# Patient Record
Sex: Male | Born: 1945
Health system: Southern US, Community
[De-identification: ages and names within clinical notes are randomized; demographics above are authoritative.]

## PROBLEM LIST (undated history)

## (undated) DIAGNOSIS — E119 Type 2 diabetes mellitus without complications: Secondary | ICD-10-CM

## (undated) DIAGNOSIS — E785 Hyperlipidemia, unspecified: Secondary | ICD-10-CM

## (undated) DIAGNOSIS — M1990 Unspecified osteoarthritis, unspecified site: Secondary | ICD-10-CM

## (undated) DIAGNOSIS — J449 Chronic obstructive pulmonary disease, unspecified: Secondary | ICD-10-CM

## (undated) DIAGNOSIS — N529 Male erectile dysfunction, unspecified: Secondary | ICD-10-CM

## (undated) DIAGNOSIS — N400 Enlarged prostate without lower urinary tract symptoms: Secondary | ICD-10-CM

## (undated) HISTORY — DX: Type 2 diabetes mellitus without complications: E11.9

## (undated) HISTORY — DX: Hyperlipidemia, unspecified: E78.5

## (undated) HISTORY — DX: Unspecified osteoarthritis, unspecified site: M19.90

---

## 2009-10-08 ENCOUNTER — Emergency Department (HOSPITAL_BASED_OUTPATIENT_CLINIC_OR_DEPARTMENT_OTHER): Admission: EM | Admit: 2009-10-08 | Discharge: 2009-10-08 | Payer: Self-pay | Admitting: Emergency Medicine

## 2009-10-08 ENCOUNTER — Ambulatory Visit: Payer: Self-pay | Admitting: Diagnostic Radiology

## 2010-12-24 IMAGING — CR DG CHEST 2V
2 series · 2 of 2 positions shown · non-contrast
Comparison: None

CLINICAL DATA: Short of breath.  Cough.  Smoking history.

CHEST - 2 VIEW

[w chest pa]
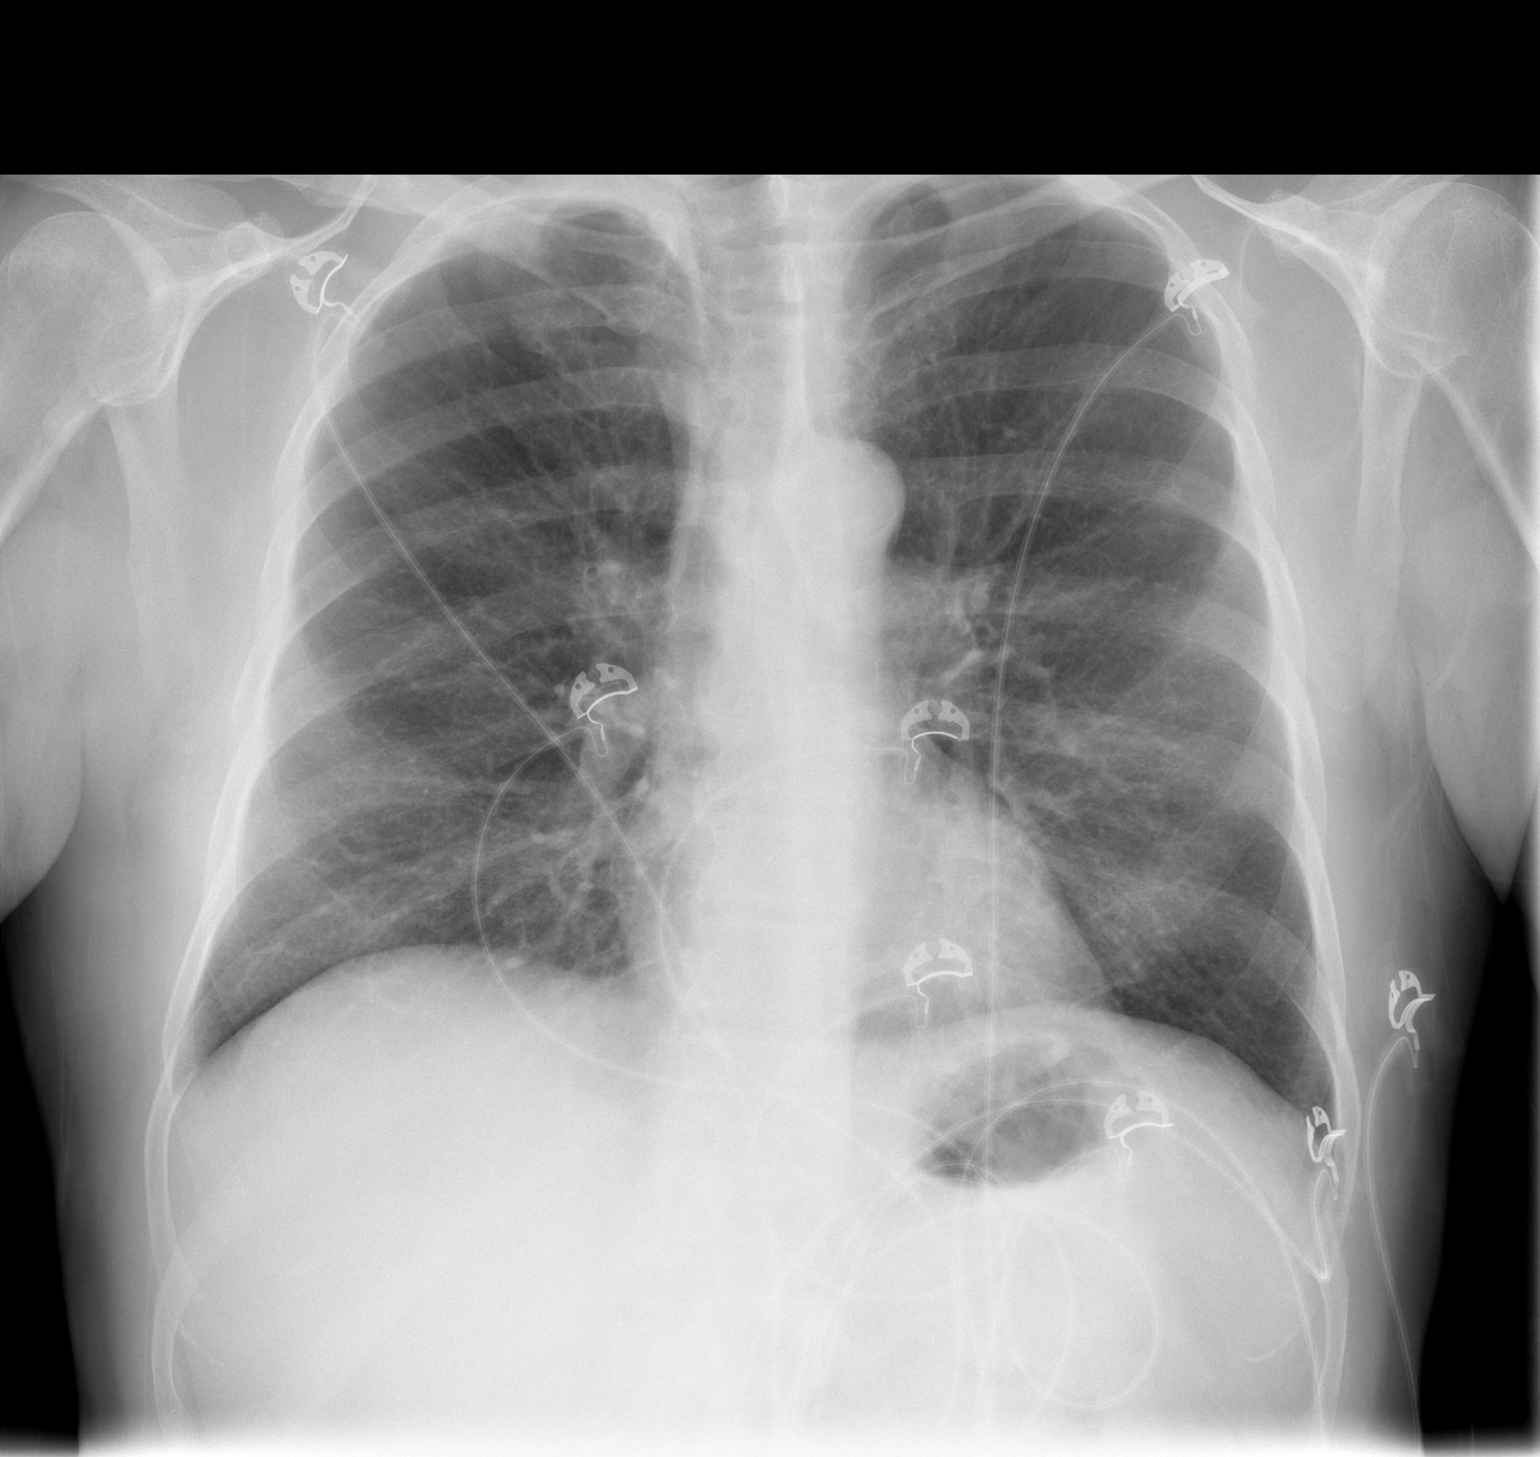

[w chest lat]
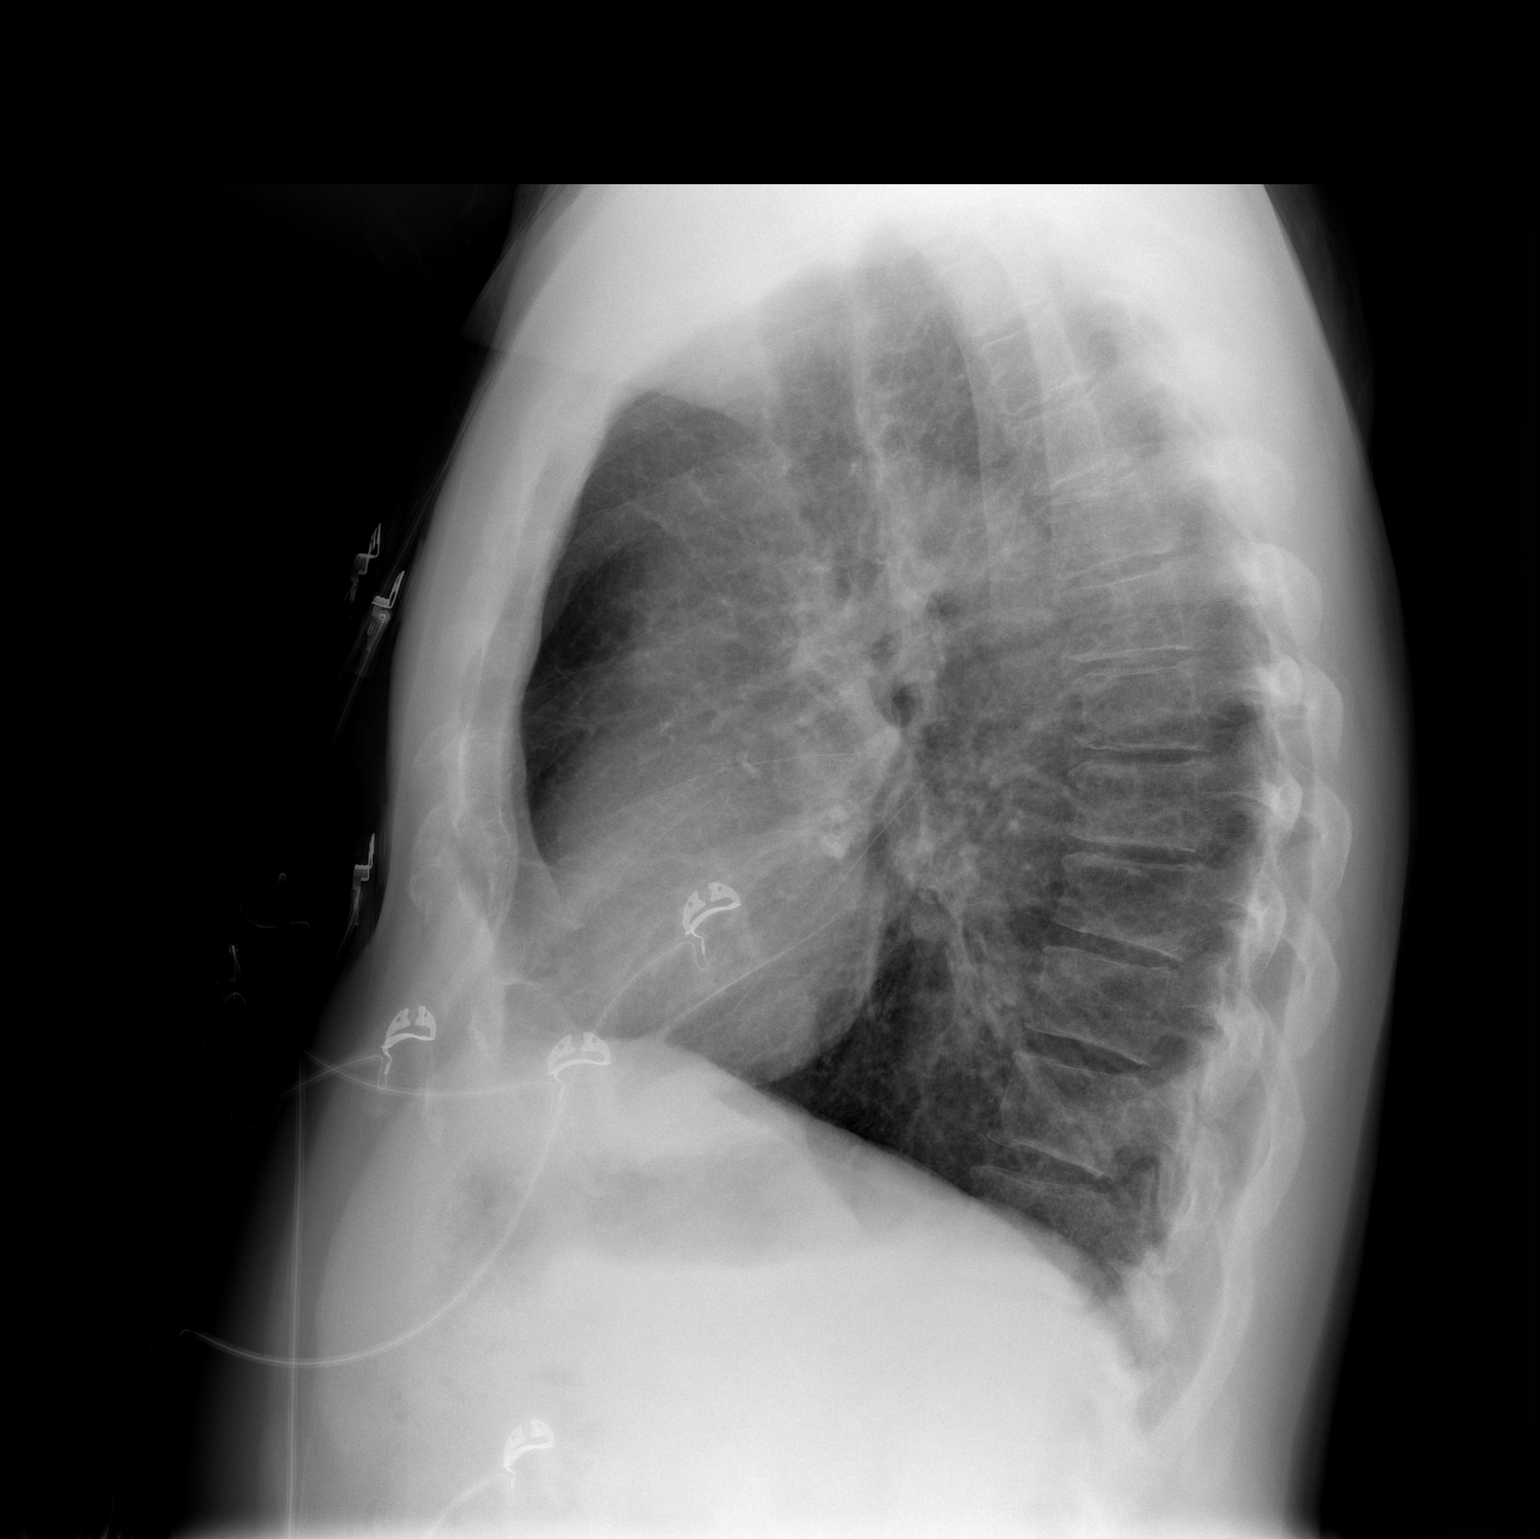

[2 of 2 positions shown; findings below may reference images not displayed]

FINDINGS: Artifact overlies the chest.  Heart size is normal.  The
mediastinum is normal.  There are slightly prominent interstitial
markings consistent with a smoking history but there is no sign of
infiltrate, collapse or effusion.  No pulmonary edema.  No
significant bony finding.
IMPRESSION: No focal or active process evident.  Slightly prominent
interstitial markings that could relate to a smoking history.

## 2017-10-07 ENCOUNTER — Other Ambulatory Visit: Payer: Self-pay

## 2017-10-07 ENCOUNTER — Emergency Department (HOSPITAL_BASED_OUTPATIENT_CLINIC_OR_DEPARTMENT_OTHER): Payer: Medicare Other

## 2017-10-07 ENCOUNTER — Encounter (HOSPITAL_BASED_OUTPATIENT_CLINIC_OR_DEPARTMENT_OTHER): Payer: Self-pay

## 2017-10-07 ENCOUNTER — Emergency Department (HOSPITAL_BASED_OUTPATIENT_CLINIC_OR_DEPARTMENT_OTHER)
Admission: EM | Admit: 2017-10-07 | Discharge: 2017-10-07 | Disposition: A | Discharge status: Home / Self Care | Payer: Medicare Other | Attending: Emergency Medicine | Admitting: Emergency Medicine

## 2017-10-07 DIAGNOSIS — J441 Chronic obstructive pulmonary disease with (acute) exacerbation: Secondary | ICD-10-CM | POA: Insufficient documentation

## 2017-10-07 DIAGNOSIS — Z87891 Personal history of nicotine dependence: Secondary | ICD-10-CM | POA: Diagnosis not present

## 2017-10-07 DIAGNOSIS — R0602 Shortness of breath: Secondary | ICD-10-CM | POA: Diagnosis present

## 2017-10-07 HISTORY — DX: Male erectile dysfunction, unspecified: N52.9

## 2017-10-07 HISTORY — DX: Chronic obstructive pulmonary disease, unspecified: J44.9

## 2017-10-07 HISTORY — DX: Benign prostatic hyperplasia without lower urinary tract symptoms: N40.0

## 2017-10-07 LAB — COMPREHENSIVE METABOLIC PANEL
ALT: 22 U/L (ref 17–63)
ANION GAP: 9 (ref 5–15)
AST: 28 U/L (ref 15–41)
Albumin: 4.1 g/dL (ref 3.5–5.0)
Alkaline Phosphatase: 80 U/L (ref 38–126)
BUN: 12 mg/dL (ref 6–20)
CHLORIDE: 106 mmol/L (ref 101–111)
CO2: 25 mmol/L (ref 22–32)
CREATININE: 0.88 mg/dL (ref 0.61–1.24)
Calcium: 9.5 mg/dL (ref 8.9–10.3)
Glucose, Bld: 124 mg/dL — ABNORMAL HIGH (ref 65–99)
Potassium: 3.9 mmol/L (ref 3.5–5.1)
SODIUM: 140 mmol/L (ref 135–145)
Total Bilirubin: 0.8 mg/dL (ref 0.3–1.2)
Total Protein: 7.7 g/dL (ref 6.5–8.1)

## 2017-10-07 LAB — CBC WITH DIFFERENTIAL/PLATELET
Basophils Absolute: 0.1 10*3/uL (ref 0.0–0.1)
Basophils Relative: 1 %
EOS ABS: 1 10*3/uL — AB (ref 0.0–0.7)
EOS PCT: 13 %
HCT: 47.4 % (ref 39.0–52.0)
Hemoglobin: 15.9 g/dL (ref 13.0–17.0)
LYMPHS ABS: 2.3 10*3/uL (ref 0.7–4.0)
LYMPHS PCT: 30 %
MCH: 31.5 pg (ref 26.0–34.0)
MCHC: 33.5 g/dL (ref 30.0–36.0)
MCV: 93.9 fL (ref 78.0–100.0)
MONO ABS: 0.9 10*3/uL (ref 0.1–1.0)
MONOS PCT: 11 %
Neutro Abs: 3.4 10*3/uL (ref 1.7–7.7)
Neutrophils Relative %: 45 %
PLATELETS: 287 10*3/uL (ref 150–400)
RBC: 5.05 MIL/uL (ref 4.22–5.81)
RDW: 13.8 % (ref 11.5–15.5)
WBC: 7.6 10*3/uL (ref 4.0–10.5)

## 2017-10-07 LAB — TROPONIN I

## 2017-10-07 MED ORDER — ALBUTEROL SULFATE (2.5 MG/3ML) 0.083% IN NEBU
2.5000 mg | INHALATION_SOLUTION | Freq: Once | RESPIRATORY_TRACT | Status: AC
  Administered 2017-10-07: 2.5 mg via RESPIRATORY_TRACT
  Filled 2017-10-07: qty 3

## 2017-10-07 MED ORDER — ALBUTEROL SULFATE HFA 108 (90 BASE) MCG/ACT IN AERS
2.0000 | INHALATION_SPRAY | RESPIRATORY_TRACT | Status: DC
  Administered 2017-10-07: 2 via RESPIRATORY_TRACT
  Filled 2017-10-07: qty 6.7

## 2017-10-07 MED ORDER — PREDNISONE 20 MG PO TABS: ORAL_TABLET | ORAL | 0 refills | Status: DC

## 2017-10-07 MED ORDER — METHYLPREDNISOLONE SODIUM SUCC 125 MG IJ SOLR
125.0000 mg | Freq: Once | INTRAMUSCULAR | Status: AC
  Administered 2017-10-07: 125 mg via INTRAVENOUS
  Filled 2017-10-07: qty 2

## 2017-10-07 MED ORDER — IPRATROPIUM-ALBUTEROL 0.5-2.5 (3) MG/3ML IN SOLN
3.0000 mL | Freq: Once | RESPIRATORY_TRACT | Status: AC
  Administered 2017-10-07: 3 mL via RESPIRATORY_TRACT
  Filled 2017-10-07: qty 3

## 2017-10-07 NOTE — ED Notes (Signed)
Patient transported to X-ray 

## 2017-10-07 NOTE — ED Notes (Signed)
ED Provider at bedside. 

## 2017-10-07 NOTE — Discharge Instructions (Signed)
1.  Use your albuterol inhaler every 4-6 hours for the next 2-3 days.  Take prednisone as prescribed. 2.  Return to the emergency department if your symptoms are worsening or not improving. 2.  Schedule a follow-up with your physician as soon as possible.

## 2017-10-07 NOTE — ED Provider Notes (Signed)
[facility] EMERGENCY DEPARTMENT Provider Note   CSN: [id number] Arrival date & time: 10/07/17  1835     History   Chief Complaint Chief Complaint  Patient presents with  . Shortness of Breath    HPI Eugene Daniels is a 71 y.o. male.  HPI Patient has a long history of COPD.  He quit smoking approximately 5 years ago.  He reports that he has needed his inhalers more frequently over the past 2-3 days.  No associated chest pain.  Mild cough.  Patient reports that he feels like he gets phlegm in his throat.  No fever.  No swelling or pain of the legs.  Patient reports this feels like a COPD flareup.  He reports in the past he has been hospitalized occasionally for his COPD and responded to steroids.  Patient is visiting from [place] with family.  He got here over a week ago.  Patient had a 45-minute direct flight with no prolonged periods of sitting or immobilization.  No history of PE or DVT.  No known cardiac history. Past Medical History:  Diagnosis Date  . COPD (chronic obstructive pulmonary disease) (HCC)   . ED (erectile dysfunction)   . Enlarged prostate     There are no active problems to display for this patient.   History reviewed. No pertinent surgical history.     Home Medications    Prior to Admission medications   Medication Sig Start Date End Date Taking? Authorizing Provider  predniSONE (DELTASONE) 20 MG tablet 3 tabs po day one, then 2 tabs daily x 4 days 10/07/17   Arby BarrettePfeiffer, Fredia [place], MD    Family History No family history on file.  Social History Social History   Tobacco Use  . Smoking status: Former Games developermoker  . Smokeless tobacco: Never Used  Substance Use Topics  . Alcohol use: Yes    Comment: occ  . Drug use: No     Allergies   Patient has no known allergies.   Review of Systems Review of Systems 10 Systems reviewed and are negative for acute change except as noted in the HPI.   Physical Exam Updated Vital Signs BP 131/77    Pulse 96   Temp 98 F (36.7 C) (Oral)   Resp (!) 22   Ht 5\' 6"  (1.676 m)   Wt 70.8 kg (156 lb)   SpO2 96%   BMI 25.18 kg/m   Physical Exam  Constitutional: He appears well-developed and well-nourished. No distress.  HENT:  Head: Normocephalic and atraumatic.  Mouth/Throat: Oropharynx is clear and moist.  Eyes: Conjunctivae and EOM are normal.  Neck: Neck supple.  Cardiovascular: Normal rate and regular rhythm.  No murmur heard. Pulmonary/Chest:  Mild increased work of breathing.  No respiratory distress.  Very soft breath sounds from mid to bases.  rexpiratiry wheeze anteriorly and right greater than left.  Abdominal: Soft. There is no tenderness.  Musculoskeletal: He exhibits no edema or tenderness.  No peripheral edema no calf tenderness.  Neurological: He is alert.  Skin: Skin is warm and dry.  Psychiatric: He has a normal mood and affect.  Nursing note and vitals reviewed.    ED Treatments / Results  Labs (all labs ordered are listed, but only abnormal results are displayed) Labs Reviewed  CBC WITH DIFFERENTIAL/PLATELET - Abnormal; Notable for the following components:      Result Value   Eosinophils Absolute 1.0 (*)    All other components within normal limits  COMPREHENSIVE METABOLIC PANEL -  Abnormal; Notable for the following components:   Glucose, Bld 124 (*)    All other components within normal limits  TROPONIN I    EKG  EKG Interpretation  Date/Time:  Wednesday October 07 2017 18:52:37 EST Ventricular Rate:  88 PR Interval:    QRS Duration: 72 QT Interval:  349 QTC Calculation: 423 R Axis:   90 Text Interpretation:  Sinus rhythm Borderline right axis deviation Baseline wander in lead(s) V6 no acute ischemic appearance, otherwise normal Confirmed by Arby BarrettePfeiffer, Deija Buhrman [phone]([id number]) on 10/07/2017 8:01:56 PM       Radiology Dg Chest 2 View  Result Date: 10/07/2017 CLINICAL DATA:  71 year old male with shortness of breath. EXAM: CHEST  2 VIEW  COMPARISON:  Chest radiograph dated 10/08/2009 FINDINGS: There is emphysematous changes of the lungs with mild diffuse interstitial coarsening. No focal consolidation, pleural effusion, or pneumothorax. The cardiac silhouette is within normal limits. Atherosclerotic calcification of the aortic arch. No acute osseous pathology. IMPRESSION: No active cardiopulmonary disease. Electronically Signed   By: Elgie CollardArash  Radparvar M.D.   On: 10/07/2017 19:20    Procedures Procedures (including critical care time)  Medications Ordered in ED Medications  albuterol (PROVENTIL HFA;VENTOLIN HFA) 108 (90 Base) MCG/ACT inhaler 2 puff (2 puffs Inhalation Given 10/07/17 2251)  ipratropium-albuterol (DUONEB) 0.5-2.5 (3) MG/3ML nebulizer solution 3 mL (3 mLs Nebulization Given 10/07/17 1852)  albuterol (PROVENTIL) (2.5 MG/3ML) 0.083% nebulizer solution 2.5 mg (2.5 mg Nebulization Given 10/07/17 1852)  albuterol (PROVENTIL) (2.5 MG/3ML) 0.083% nebulizer solution 2.5 mg (2.5 mg Nebulization Given 10/07/17 1928)  albuterol (PROVENTIL) (2.5 MG/3ML) 0.083% nebulizer solution 2.5 mg (2.5 mg Nebulization Given 10/07/17 2007)  methylPREDNISolone sodium succinate (SOLU-MEDROL) 125 mg/2 mL injection 125 mg (125 mg Intravenous Given 10/07/17 2016)     Initial Impression / Assessment and Plan / ED Course  I have reviewed the triage vital signs and the nursing notes.  Pertinent labs & imaging results that were available during my care of the patient were reviewed by me and considered in my medical decision making (see chart for details).      Final Clinical Impressions(s) / ED Diagnoses   Final diagnoses:  COPD exacerbation (HCC)   Patient much improved after albuterol nebulizer treatment and Solu-Medrol.  Patient does not have hypoxia, confusion or any findings of infectious pneumonia.  Patient will continue prednisone and albuterol.  He is instructed to return with any worsening or changing of symptoms. ED Discharge  Orders        Ordered    predniSONE (DELTASONE) 20 MG tablet     10/07/17 2253       Arby BarrettePfeiffer, Chee Kinslow, MD 10/07/17 2254

## 2017-10-07 NOTE — ED Triage Notes (Signed)
C/o SOB, "COPD" x 2 days-presents to triage with tachypnea-RT in triage for assessment

## 2018-06-23 ENCOUNTER — Telehealth: Payer: Self-pay

## 2018-06-23 NOTE — Telephone Encounter (Signed)
Copied from CRM [id number]. Topic: Appointment Scheduling - New Patient >> Jun 23, 2018  2:53 PM Stephannie Li, [place] wrote: New patient has been scheduled for your office. Provider: wendling  Date of Appointment: 07/07/18  Route to department's PEC pool.  Ok.

## 2018-07-07 ENCOUNTER — Ambulatory Visit (INDEPENDENT_AMBULATORY_CARE_PROVIDER_SITE_OTHER): Payer: Medicare PPO | Admitting: Family Medicine

## 2018-07-07 ENCOUNTER — Encounter: Payer: Self-pay | Admitting: Family Medicine

## 2018-07-07 VITALS — BP 114/64 | HR 90 | Temp 98.1°F | Resp 16 | Ht 66.0 in | Wt 161.0 lb

## 2018-07-07 DIAGNOSIS — R350 Frequency of micturition: Secondary | ICD-10-CM

## 2018-07-07 DIAGNOSIS — G2581 Restless legs syndrome: Secondary | ICD-10-CM | POA: Diagnosis not present

## 2018-07-07 DIAGNOSIS — I739 Peripheral vascular disease, unspecified: Secondary | ICD-10-CM

## 2018-07-07 DIAGNOSIS — B351 Tinea unguium: Secondary | ICD-10-CM | POA: Diagnosis not present

## 2018-07-07 DIAGNOSIS — J449 Chronic obstructive pulmonary disease, unspecified: Secondary | ICD-10-CM

## 2018-07-07 DIAGNOSIS — N401 Enlarged prostate with lower urinary tract symptoms: Secondary | ICD-10-CM

## 2018-07-07 MED ORDER — BENZONATATE 100 MG PO CAPS: 100.0000 mg | ORAL_CAPSULE | Freq: Three times a day (TID) | ORAL | 1 refills | Status: DC | PRN

## 2018-07-07 MED ORDER — PREDNISONE 5 MG PO TABS: ORAL_TABLET | ORAL | 0 refills | Status: DC

## 2018-07-07 MED ORDER — FLUTICASONE-SALMETEROL 250-50 MCG/DOSE IN AEPB: 1.0000 | INHALATION_SPRAY | Freq: Two times a day (BID) | RESPIRATORY_TRACT | 2 refills | Status: DC

## 2018-07-07 NOTE — Progress Notes (Signed)
CC: NP, est care     New Patient Visit SUBJECTIVE: HPI: Eugene Daniels is an 72 y.o.male who is being seen for establishing care.  The patient was previously seen in [place].  He is here with his daughter.  He has a history of COPD.  Currently his inhalers include an anticholinergic muscarinic antagonist and rescue inhaler.  He is to be on other inhalers as well but did not get them was moving to [place].  He was given Jerilynn Som for a cough that seem to be helpful.  He is also currently on steroids after being treated for an exacerbation.  He would like to come off of them.  He has a history of toenail fungus.  He is not interested in any oral medication that could be damaging to his liver.  He is willing to try a topical medication.  There is no pain or drainage.  He has a history of RLS with associated peripheral arterial disease.  He was told that he has blockage in both of his legs.  He is currently on aspirin and a statin.  He is not having any pain.  He has never been on any medication for RLS.  He is unsure if his iron levels were rechecked.  He has a history of an enlarged prostate.  He takes a supplement that is over-the-counter to help with this.  He still having frequent urination, particularly at night.  He has been on the medication for this in the past, however states it was not easy on his stomach.  No Known Allergies  Past Medical History:  Diagnosis Date  . Arthritis   . COPD (chronic obstructive pulmonary disease) (HCC)   . ED (erectile dysfunction)   . Enlarged prostate   . Hyperlipidemia    History reviewed. No pertinent surgical history. History reviewed. No pertinent family history. No Known Allergies  Current Outpatient Medications:  .  Albuterol Sulfate (PROAIR HFA IN), Inhale into the lungs., Disp: , Rfl:  .  aspirin 81 MG tablet, Take 81 mg by mouth daily., Disp: , Rfl:  .  atorvastatin (LIPITOR) 40 MG tablet, Take 40 mg by mouth daily., Disp:  , Rfl:  .  loratadine (CLARITIN) 10 MG tablet, Take 10 mg by mouth daily., Disp: , Rfl:  .  Melatonin 3 MG CAPS, Take 3 mg by mouth at bedtime as needed., Disp: , Rfl:  .  umeclidinium bromide (INCRUSE ELLIPTA) 62.5 MCG/INH AEPB, Inhale 1 puff into the lungs daily., Disp: , Rfl:  .  UNABLE TO FIND, Prostate Defense, Disp: , Rfl:  .  benzonatate (TESSALON) 100 MG capsule, Take 1 capsule (100 mg total) by mouth 3 (three) times daily as needed., Disp: 90 capsule, Rfl: 1 .  Fluticasone-Salmeterol (ADVAIR) 250-50 MCG/DOSE AEPB, Inhale 1 puff into the lungs 2 (two) times daily., Disp: 60 each, Rfl: 2 .  predniSONE (DELTASONE) 5 MG tablet, Take 3 tabs/day for 5 days then 2 tabs/day for 5 days then 1 tab daily., Disp: 30 tablet, Rfl: 0  ROS 10 point review of systems is negative unless otherwise noted in HPI  OBJECTIVE: BP 114/64   Pulse 90   Temp 98.1 F (36.7 C) (Oral)   Resp 16   Ht 5\' 6"  (1.676 m)   Wt 161 lb (73 kg)   SpO2 97%   BMI 25.99 kg/m   Constitutional: -  VS reviewed -  Well developed, well nourished, appears stated age -  No apparent distress  Psychiatric: -  Oriented to person, place, and time -  Memory intact -  Affect and mood normal -  Fluent conversation, good eye contact -  Judgment and insight age appropriate  Eye: -  Conjunctivae clear, no discharge -  Pupils symmetric, round, reactive to light  ENMT: -  MMM    Pharynx moist, no exudate, no erythema  Neck: -  No gross swelling, no palpable masses -  Thyroid midline, not enlarged, mobile, no palpable masses  Cardiovascular: -  RRR -  No LE edema  Respiratory: -  Normal respiratory effort, no accessory muscle use, no retraction -  Breath sounds equal, no wheezes, no ronchi, no crackles  Gastrointestinal: -  Bowel sounds normal -  No tenderness, no distention, no guarding, no masses  Neurological:  -  CN II - XII grossly intact -  Sensation grossly intact to light touch, equal bilaterally  Musculoskeletal: -   No clubbing, no cyanosis -  Gait normal  Skin: -Toenails on left foot are discolored and hypertrophic, there is no tenderness upon palpation of any of the nail suggesting an ingrown toenail -  Warm and dry to palpation   ASSESSMENT/PLAN: Benign prostatic hyperplasia with urinary frequency  PAD (peripheral artery disease) (HCC) - Plan: atorvastatin (LIPITOR) 40 MG tablet, aspirin 81 MG tablet  RLS (restless legs syndrome) - Plan: CBC, Ferritin, IBC panel  Onychomycosis  Chronic obstructive pulmonary disease, unspecified COPD type (HCC) - Plan: umeclidinium bromide (INCRUSE ELLIPTA) 62.5 MCG/INH AEPB, Albuterol Sulfate (PROAIR HFA IN), benzonatate (TESSALON) 100 MG capsule, Fluticasone-Salmeterol (ADVAIR) 250-50 MCG/DOSE AEPB, predniSONE (DELTASONE) 5 MG tablet  Patient instructed to sign release of records form from his previous PCP. For BPH, continue over-the-counter supplement.  I will await records and see if his reaction was to an alpha-blocker. Will await records on PAD and RLS.  We will check iron levels today.  Consider starting Requip depending on the results. Discussed oral medication for #4.  He would prefer topical medication, I recommended Vicks VapoRub/menthol.  I did advise that this could take a long time. We will order an inhaled corticosteroid/long-acting beta agonist in addition to his long-acting muscarinic antagonist.  Albuterol as needed.  Will temporarily refill Tessalon Perles.  I will start to wean him down from his current prednisone. Patient should return in 1 month. The patient voiced understanding and agreement to the plan.   Jilda Roche [place], DO 07/07/18  5:11 PM

## 2018-07-07 NOTE — Patient Instructions (Addendum)
We are holding off on medication for prostate and RLS until seeing records.  Vicks Vaporub daily on toenails to treat fungus. This can take a long time.  Rinse your mouth out after using new inhaler (Advair).  Start weaning down on steroids/prednisone.  Give [place] 2-3 business days to get the results of your labs back.   Let us know if you need anything.

## 2018-07-08 LAB — CBC
HEMATOCRIT: 43.5 % (ref 39.0–52.0)
HEMOGLOBIN: 14.8 g/dL (ref 13.0–17.0)
MCHC: 34.1 g/dL (ref 30.0–36.0)
MCV: 93.1 fl (ref 78.0–100.0)
PLATELETS: 294 10*3/uL (ref 150.0–400.0)
RBC: 4.68 Mil/uL (ref 4.22–5.81)
RDW: 14.2 % (ref 11.5–15.5)
WBC: 7.1 10*3/uL (ref 4.0–10.5)

## 2018-07-08 LAB — IBC PANEL
Iron: 137 ug/dL (ref 42–165)
SATURATION RATIOS: 34 % (ref 20.0–50.0)
Transferrin: 288 mg/dL (ref 212.0–360.0)

## 2018-07-08 LAB — FERRITIN: Ferritin: 219.2 ng/mL (ref 22.0–322.0)

## 2018-07-09 ENCOUNTER — Telehealth: Payer: Self-pay

## 2018-07-09 ENCOUNTER — Telehealth: Payer: Self-pay | Admitting: *Deleted

## 2018-07-09 NOTE — Telephone Encounter (Signed)
-----   Message from Sharlene Dory, DO sent at 07/08/2018  5:14 PM EDT ----- Let pt know labs look good, we await his records. TY.

## 2018-07-09 NOTE — Telephone Encounter (Signed)
Received Medical records from [facility]; forwarded to provider/SLS 09/27

## 2018-07-09 NOTE — Telephone Encounter (Signed)
Author phoned pt. to relay that labwork looked good per Dr. Carmelia Roller. No answer, author left detailed VM.

## 2018-07-16 ENCOUNTER — Other Ambulatory Visit: Payer: Self-pay | Admitting: Family Medicine

## 2018-07-16 DIAGNOSIS — I739 Peripheral vascular disease, unspecified: Secondary | ICD-10-CM

## 2018-07-16 MED ORDER — ATORVASTATIN CALCIUM 40 MG PO TABS: 40.0000 mg | ORAL_TABLET | Freq: Every day | ORAL | 3 refills | Status: DC

## 2018-07-20 ENCOUNTER — Encounter: Payer: Self-pay | Admitting: Family Medicine

## 2018-07-21 ENCOUNTER — Telehealth: Payer: Self-pay | Admitting: *Deleted

## 2018-07-21 ENCOUNTER — Other Ambulatory Visit: Payer: Self-pay | Admitting: Family Medicine

## 2018-07-21 MED ORDER — ALBUTEROL SULFATE 0.63 MG/3ML IN NEBU: 1.0000 | INHALATION_SOLUTION | Freq: Four times a day (QID) | RESPIRATORY_TRACT | 3 refills | Status: DC | PRN

## 2018-07-21 NOTE — Telephone Encounter (Signed)
Received Medical records from [facility]; forwarded to provider/SLS 10/09

## 2018-08-06 ENCOUNTER — Ambulatory Visit (INDEPENDENT_AMBULATORY_CARE_PROVIDER_SITE_OTHER): Payer: Medicare HMO | Admitting: Family Medicine

## 2018-08-06 ENCOUNTER — Encounter: Payer: Self-pay | Admitting: Family Medicine

## 2018-08-06 VITALS — BP 130/70 | HR 83 | Temp 97.6°F | Ht 66.0 in | Wt 166.4 lb

## 2018-08-06 DIAGNOSIS — Z23 Encounter for immunization: Secondary | ICD-10-CM

## 2018-08-06 DIAGNOSIS — J449 Chronic obstructive pulmonary disease, unspecified: Secondary | ICD-10-CM | POA: Diagnosis not present

## 2018-08-06 MED ORDER — FLUTICASONE PROPIONATE HFA 110 MCG/ACT IN AERO: INHALATION_SPRAY | RESPIRATORY_TRACT | 1 refills | Status: DC

## 2018-08-06 NOTE — Patient Instructions (Addendum)
Remember to rinse your mouth out after you use your Advair.  Do not use albuterol (the inhaler or nebulization) unless you are short of breath or actively wheezing.  OK to try humidifier.   If you do not hear anything about your referral in the next 1-2 weeks, call our office and ask for an update.  Let us know if you need anything.

## 2018-08-06 NOTE — Addendum Note (Signed)
Addended by: Scharlene Gloss B on: 08/06/2018 11:45 AM   Modules accepted: Orders

## 2018-08-06 NOTE — Progress Notes (Signed)
Chief Complaint  Patient presents with  . Breathing Problem    Eugene Daniels is a 72 y.o. male here for COPD. Here w daughter.   Currently treated with Incruse and Advair. Compliance is excellent. Uses rescue inhaler 2 times per day over past 2 weeks. Reports breathing is good overall prior to this. Pt and daughter are requesting pulmonary rehab. Has been using alb nebs twice daily, scheduled. Does not remember to rinse mouth out after Advair.   ROS:  Lungs: +coughing and sob Const: No fevers  Past Medical History:  Diagnosis Date  . Arthritis   . COPD (chronic obstructive pulmonary disease) (HCC)   . ED (erectile dysfunction)   . Enlarged prostate   . Hyperlipidemia     BP 130/70 (BP Location: Left Arm, Patient Position: Sitting, Cuff Size: Normal)   Pulse 83   Temp 97.6 F (36.4 C) (Oral)   Ht 5\' 6"  (1.676 m)   Wt 166 lb 6 oz (75.5 kg)   SpO2 95%   BMI 26.85 kg/m  Gen: Awake, alert HEENT: MMM, nares patent, no polyps Heart: RRR, no LE edema Lungs: CTAB, no accessory muscle use Msk: No clubbing or cyanosis Psych: Age appropriate judgment and insight  Chronic obstructive pulmonary disease, unspecified COPD type (HCC) - Plan: AMB referral to pulmonary rehabilitation, fluticasone (FLOVENT HFA) 110 MCG/ACT inhaler  Add ICS for next 10 days to help with breathing. It sounds like his current regimen works well prior to the weather change.  Refer to pulm rehab. States insurance will cover in full.  F/u in 3 mo or prn. The patient and his daughter voiced understanding and agreement to the plan.  Jilda Roche [place], DO 08/06/18 11:33 AM

## 2018-08-06 NOTE — Progress Notes (Signed)
Pre visit review using our clinic review tool, if applicable. No additional management support is needed unless otherwise documented below in the visit note. 

## 2018-10-28 DIAGNOSIS — E785 Hyperlipidemia, unspecified: Secondary | ICD-10-CM | POA: Diagnosis not present

## 2018-10-28 DIAGNOSIS — M6281 Muscle weakness (generalized): Secondary | ICD-10-CM | POA: Diagnosis not present

## 2018-10-28 DIAGNOSIS — J441 Chronic obstructive pulmonary disease with (acute) exacerbation: Secondary | ICD-10-CM | POA: Diagnosis not present

## 2018-11-10 ENCOUNTER — Ambulatory Visit (INDEPENDENT_AMBULATORY_CARE_PROVIDER_SITE_OTHER): Payer: Medicare HMO | Admitting: Family Medicine

## 2018-11-10 ENCOUNTER — Encounter: Payer: Self-pay | Admitting: Family Medicine

## 2018-11-10 VITALS — BP 108/70 | HR 70 | Temp 97.6°F | Ht 66.0 in | Wt 167.5 lb

## 2018-11-10 DIAGNOSIS — J302 Other seasonal allergic rhinitis: Secondary | ICD-10-CM

## 2018-11-10 DIAGNOSIS — E785 Hyperlipidemia, unspecified: Secondary | ICD-10-CM

## 2018-11-10 DIAGNOSIS — J449 Chronic obstructive pulmonary disease, unspecified: Secondary | ICD-10-CM | POA: Diagnosis not present

## 2018-11-10 LAB — LIPID PANEL
CHOLESTEROL: 104 mg/dL (ref 0–200)
HDL: 33.5 mg/dL — AB (ref >39.00)
LDL CALC: 46 mg/dL (ref 0–99)
NONHDL: 70.49
Total CHOL/HDL Ratio: 3
Triglycerides: 121 mg/dL (ref 0.0–149.0)
VLDL: 24.2 mg/dL (ref 0.0–40.0)

## 2018-11-10 MED ORDER — LEVOCETIRIZINE DIHYDROCHLORIDE 5 MG PO TABS: 5.0000 mg | ORAL_TABLET | Freq: Every evening | ORAL | 11 refills | Status: AC

## 2018-11-10 NOTE — Progress Notes (Signed)
Pre visit review using our clinic review tool, if applicable. No additional management support is needed unless otherwise documented below in the visit note. 

## 2018-11-10 NOTE — Progress Notes (Addendum)
Chief Complaint  Patient presents with  . Follow-up    Eugene Daniels is a 73 y.o. male here for COPD.  Currently treated with Advair and Seebri. Compliance is excellent. Uses rescue inhaler 0 times per week. Reports breathing is good overall. No nighttime awakenings.  Hyperlipidemia Patient presents for dyslipidemia follow up. Currently being treated with atorvastatin 40 mg/d and compliance with treatment thus far has been good. He denies myalgias. He is not adhering to a healthy. Exercise: None The patient is not known to have coexisting coronary artery disease.  Hx of allergies, currently on INCS and Claritin. Sneezing freq. No fevers or other URI s/s's.   ROS:  Lungs: No SOB Const: No fevers  Past Medical History:  Diagnosis Date  . Arthritis   . COPD (chronic obstructive pulmonary disease) (HCC)   . ED (erectile dysfunction)   . Enlarged prostate   . Hyperlipidemia     BP 108/70 (BP Location: Left Arm, Patient Position: Sitting, Cuff Size: Normal)   Pulse 70   Temp 97.6 F (36.4 C) (Oral)   Ht 5\' 6"  (1.676 m)   Wt 167 lb 8 oz (76 kg)   SpO2 96%   BMI 27.04 kg/m  Gen: Awake, alert HEENT: MMM, nares patent, no polyps Heart: RRR, no LE edema Lungs: CTAB, no accessory muscle use Msk: No clubbing or cyanosis Psych: Age appropriate judgment and insight  Chronic obstructive pulmonary disease, unspecified COPD type (HCC) - Plan: Glycopyrrolate (SEEBRI NEOHALER) 15.6 MCG CAPS, fluticasone (FLOVENT HFA) 110 MCG/ACT inhaler  Hyperlipidemia, unspecified hyperlipidemia type - Plan: Lipid panel  Seasonal allergies - Plan: levocetirizine (XYZAL) 5 MG tablet  Orders as above. Stop [employer].  Cont statin.  Start Xyzal.  F/u in 6 mo. The patient voiced understanding and agreement to the plan.  Jilda Roche [place], DO 11/10/18 1:26 PM

## 2018-11-10 NOTE — Addendum Note (Signed)
Addended by: Radene Gunning on: 11/10/2018 01:27 PM   Modules accepted: Level of Service

## 2018-11-10 NOTE — Patient Instructions (Addendum)
Give us 2-3 business days to get the results of your labs back.   Keep the diet clean and stay active.  Aim to do some physical exertion for 150 minutes per week. This is typically divided into 5 days per week, 30 minutes per day. The activity should be enough to get your heart rate up. Anything is better than nothing if you have time constraints.  Stop taking the [employer] Perles.   Let us know if you need anything.

## 2018-12-23 IMAGING — CR DG CHEST 2V
2 series · 2 of 2 positions shown · non-contrast
Comparison: Chest radiograph dated 10/08/2009

CLINICAL DATA: 71-year-old male with shortness of breath.

EXAM:
CHEST  2 VIEW

[w chest pa]
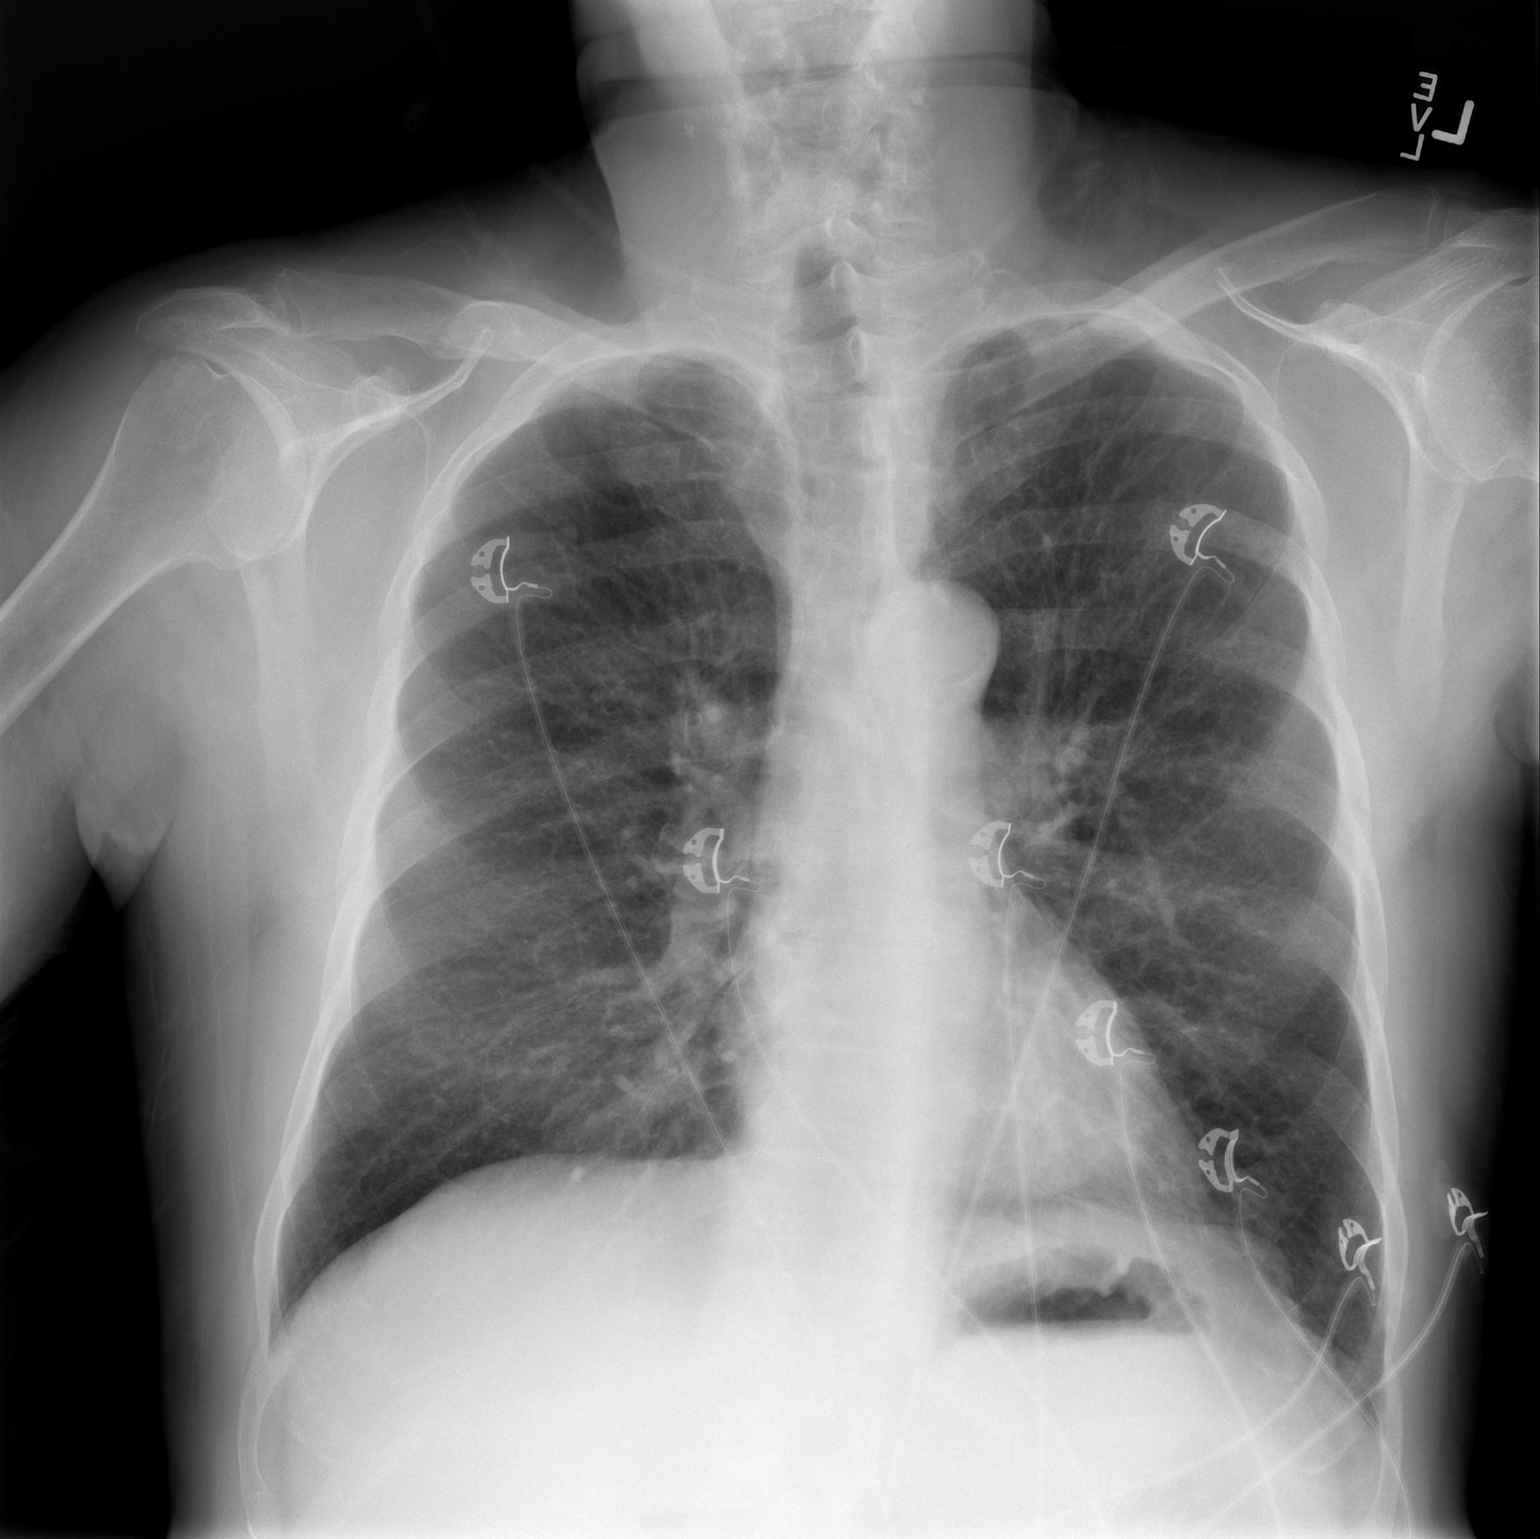

[w chest lat]
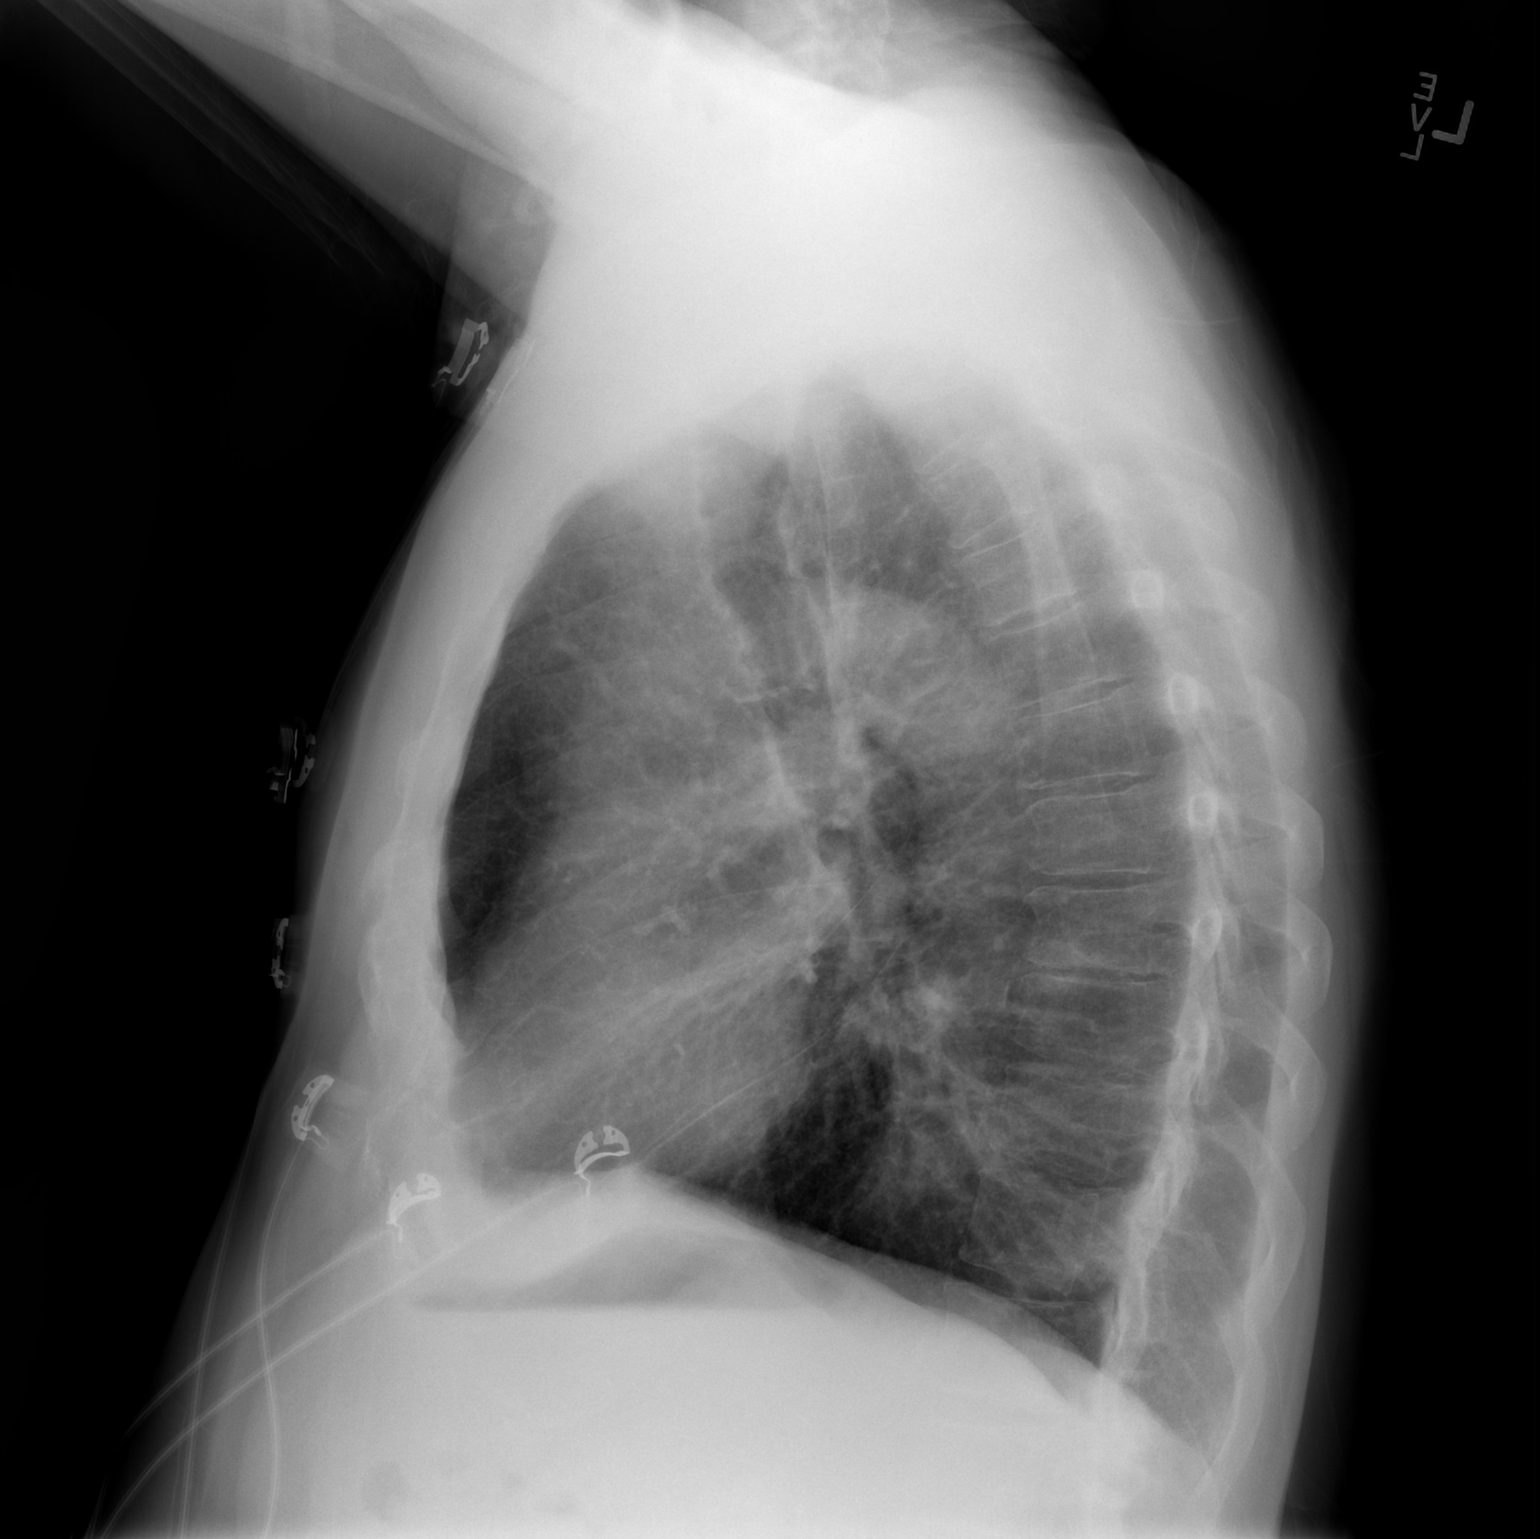

[2 of 2 positions shown; findings below may reference images not displayed]

FINDINGS: There is emphysematous changes of the lungs with mild diffuse
interstitial coarsening. No focal consolidation, pleural effusion,
or pneumothorax. The cardiac silhouette is within normal limits.
Atherosclerotic calcification of the aortic arch. No acute osseous
pathology.
IMPRESSION: No active cardiopulmonary disease.

## 2019-03-24 ENCOUNTER — Other Ambulatory Visit: Payer: Self-pay

## 2019-03-24 ENCOUNTER — Ambulatory Visit (INDEPENDENT_AMBULATORY_CARE_PROVIDER_SITE_OTHER)
Admission: RE | Admit: 2019-03-24 | Discharge: 2019-03-24 | Disposition: A | Discharge status: Home / Self Care | Payer: Medicare HMO | Source: Ambulatory Visit

## 2019-03-24 DIAGNOSIS — J019 Acute sinusitis, unspecified: Secondary | ICD-10-CM

## 2019-03-24 DIAGNOSIS — B9689 Other specified bacterial agents as the cause of diseases classified elsewhere: Secondary | ICD-10-CM

## 2019-03-24 MED ORDER — DOXYCYCLINE HYCLATE 100 MG PO CAPS: 100.0000 mg | ORAL_CAPSULE | Freq: Two times a day (BID) | ORAL | 0 refills | Status: DC

## 2019-03-24 NOTE — Discharge Instructions (Addendum)
Start doxycycline: this will help cover for your sinus infection and cover for any underlying lung infection. Return to clinic for in-person evaluation if symptoms do not improve in 2-3 days, or you develop worsening sinus pressure, cough, fever.

## 2019-03-24 NOTE — ED Provider Notes (Signed)
Virtual Visit via Video Note:  Eugene Daniels  initiated request for Telemedicine visit with [facility] team. I connected with Eugene Daniels  on 03/24/2019 at 7:36 PM  for a synchronized telemedicine visit using a video enabled HIPPA compliant telemedicine application. I verified that I am speaking with Eugene Daniels  using two identifiers. Eugene Hall-Potvin, PA-C  was physically located in a [place] [facility] care site and Eugene Daniels was located at a different location.   The limitations of evaluation and management by telemedicine as well as the availability of in-person appointments were discussed. Patient was informed that he  may incur a bill ( including co-pay) for this virtual visit encounter. Eugene Daniels  expressed understanding and gave verbal consent to proceed with virtual visit.     History of Present Illness:Eugene Daniels  is a 73 y.o. male with history of COPD presenting for acute concern of sinus infection.  Patient states that for over 1 week he has had persistent nasal congestion, sinus pain and pressure, intermittent frontal headache, increased dyspnea, and now productive, non-hemoptic cough.  Patient states this started the previous week with seasonal allergy symptoms including watery eyes, rhinorrhea, nasal congestion, sneezing, dry cough.  Patient has been taking OTC allergy medications as well as Flonase nasal spray daily without improvement of symptoms.  Patient currently taking Advair and Seebri inhalers twice daily for COPD maintenance.  Has albuterol MDI as well as nebulizer solution for exacerbations.  Patient has been using his MDI more frequently: 2-3 times daily.  Patient tends to use nebulizer at nighttime as baseline.  Patient admits to single episode of nonbloody, non-biliary emesis second to cough last night.  Patient has pulse oximeter at home: Pulse ox reportedly 96% currently.  States this is his baseline.  Patient denies fever,  myalgias, chest pain, palpitations, lower extremity edema.  States that he has been treated in the past for COPD exacerbations with azithromycin and prednisone.  Patient states he does not tolerate prednisone well.    Past Medical History:  Diagnosis Date  . Arthritis   . COPD (chronic obstructive pulmonary disease) ([place])   . ED (erectile dysfunction)   . Enlarged prostate   . Hyperlipidemia     No Known Allergies      Observations/Objective:   Assessment and Plan: 73 year old male with multiple maintenance inhalers for COPD presenting for worsening allergy symptoms that have progressed to over 1 weeks duration of severe sinus congestion and pressure/pain with now productive cough and increased dyspnea.  Due to adverse effect of prednisone, will start with antibiotics.  Patient was taking Seebri 1 puff twice daily, chart review shows he was prescribed 2 puffs twice daily: Will work on compliance with this improved baseline function.  Follow Up Instructions: Patient to follow-up with PCP if symptoms worsen.  Discussed return precautions including worsening shortness of breath, fever, myalgias, chest pain.  Patient verbalized understanding.   I discussed the assessment and treatment plan with the patient. The patient was provided an opportunity to ask questions and all were answered. The patient agreed with the plan and demonstrated an understanding of the instructions.   The patient was advised to call back or seek an in-person evaluation if the symptoms worsen or if the condition fails to improve as anticipated.  I provided 25 minutes of non-face-to-face time during this encounter.    [place], PA-C  03/24/2019 7:36 PM        Daniels, Eugene, Hershal Coria 03/24/19 1937

## 2019-03-25 ENCOUNTER — Emergency Department (HOSPITAL_BASED_OUTPATIENT_CLINIC_OR_DEPARTMENT_OTHER)
Admission: EM | Admit: 2019-03-25 | Discharge: 2019-03-25 | Disposition: A | Discharge status: Home / Self Care | Payer: Medicare HMO | Attending: Emergency Medicine | Admitting: Emergency Medicine

## 2019-03-25 ENCOUNTER — Encounter (HOSPITAL_BASED_OUTPATIENT_CLINIC_OR_DEPARTMENT_OTHER): Payer: Self-pay | Admitting: *Deleted

## 2019-03-25 ENCOUNTER — Emergency Department (HOSPITAL_BASED_OUTPATIENT_CLINIC_OR_DEPARTMENT_OTHER): Payer: Medicare HMO

## 2019-03-25 ENCOUNTER — Other Ambulatory Visit: Payer: Self-pay

## 2019-03-25 DIAGNOSIS — Z7982 Long term (current) use of aspirin: Secondary | ICD-10-CM | POA: Diagnosis not present

## 2019-03-25 DIAGNOSIS — Z87891 Personal history of nicotine dependence: Secondary | ICD-10-CM | POA: Insufficient documentation

## 2019-03-25 DIAGNOSIS — J40 Bronchitis, not specified as acute or chronic: Secondary | ICD-10-CM | POA: Diagnosis not present

## 2019-03-25 DIAGNOSIS — Z79899 Other long term (current) drug therapy: Secondary | ICD-10-CM | POA: Insufficient documentation

## 2019-03-25 DIAGNOSIS — R000 Tachycardia, unspecified: Secondary | ICD-10-CM | POA: Diagnosis not present

## 2019-03-25 DIAGNOSIS — J449 Chronic obstructive pulmonary disease, unspecified: Secondary | ICD-10-CM | POA: Diagnosis not present

## 2019-03-25 DIAGNOSIS — R0602 Shortness of breath: Secondary | ICD-10-CM | POA: Diagnosis not present

## 2019-03-25 LAB — BASIC METABOLIC PANEL
Anion gap: 11 (ref 5–15)
BUN: 10 mg/dL (ref 8–23)
CO2: 19 mmol/L — ABNORMAL LOW (ref 22–32)
Calcium: 9.2 mg/dL (ref 8.9–10.3)
Chloride: 108 mmol/L (ref 98–111)
Creatinine, Ser: 0.94 mg/dL (ref 0.61–1.24)
GFR calc Af Amer: >60 mL/min (ref >60)
GFR calc non Af Amer: >60 mL/min (ref >60)
Glucose, Bld: 163 mg/dL — ABNORMAL HIGH (ref 70–99)
Potassium: 3.9 mmol/L (ref 3.5–5.1)
Sodium: 138 mmol/L (ref 135–145)

## 2019-03-25 LAB — CBC
HCT: 48.3 % (ref 39.0–52.0)
Hemoglobin: 15.9 g/dL (ref 13.0–17.0)
MCH: 29.9 pg (ref 26.0–34.0)
MCHC: 32.9 g/dL (ref 30.0–36.0)
MCV: 91 fL (ref 80.0–100.0)
Platelets: 253 10*3/uL (ref 150–400)
RBC: 5.31 MIL/uL (ref 4.22–5.81)
RDW: 13.2 % (ref 11.5–15.5)
WBC: 15.1 10*3/uL — ABNORMAL HIGH (ref 4.0–10.5)
nRBC: 0 % (ref 0.0–0.2)

## 2019-03-25 LAB — TROPONIN I: Troponin I: <0.03 ng/mL (ref <0.03)

## 2019-03-25 LAB — BRAIN NATRIURETIC PEPTIDE: B Natriuretic Peptide: 31.1 pg/mL (ref 0.0–100.0)

## 2019-03-25 MED ORDER — ALBUTEROL SULFATE (2.5 MG/3ML) 0.083% IN NEBU: 5.0000 mg | INHALATION_SOLUTION | Freq: Once | RESPIRATORY_TRACT | Status: DC

## 2019-03-25 NOTE — ED Notes (Signed)
pts daughter, Trenton Gammon, can be reached at [phone] if needed.

## 2019-03-25 NOTE — ED Triage Notes (Addendum)
Sob for a week. No pain. No fever. He was seen at [facility] yesterday and diagnosed with acute sinusitis.

## 2019-03-25 NOTE — ED Provider Notes (Addendum)
[facility] EMERGENCY DEPARTMENT Provider Note   CSN: [id number] Arrival date & time: 03/25/19  1124    History   Chief Complaint Chief Complaint  Patient presents with  . Shortness of Breath    HPI Eugene Daniels is a 73 y.o. male.     Patient is a 73 year old male with a history of COPD and hyperlipidemia who presents with shortness of breath.  He has had about a 2 to 3-day history of runny nose nasal congestion and coughing.  He does report some shortness of breath but states it feels more like his nasal congestion is make him short of breath rather than his lungs.  He does have a cough which he says is mostly dry.  He denies any chest pain.  No leg swelling.  No known fevers.  He has been using his inhalers and nebulizers with some improvement in symptoms.  He does also report that he uses a nasal spray.  He states he has had 2 episodes of vomiting, one yesterday and one this morning.  This morning he vomited after he tried to swallow his Claritin and other medication with some water.  He has had no further vomiting.  He denies any nausea.  No abdominal pain.  He had a telehealth visit yesterday and was started on doxycycline for sinusitis.     Past Medical History:  Diagnosis Date  . Arthritis   . COPD (chronic obstructive pulmonary disease) (HCC)   . ED (erectile dysfunction)   . Enlarged prostate   . Hyperlipidemia     Patient Active Problem List   Diagnosis Date Noted  . Hyperlipidemia 11/10/2018  . Seasonal allergies 11/10/2018  . Benign prostatic hyperplasia with urinary frequency 07/07/2018  . PAD (peripheral artery disease) (HCC) 07/07/2018  . RLS (restless legs syndrome) 07/07/2018  . Onychomycosis 07/07/2018  . Chronic obstructive pulmonary disease (HCC) 07/07/2018    History reviewed. No pertinent surgical history.      Home Medications    Prior to Admission medications   Medication Sig Start Date End Date Taking? Authorizing Provider   albuterol (ACCUNEB) 0.63 MG/3ML nebulizer solution Take 3 mLs (0.63 mg total) by nebulization every 6 (six) hours as needed for wheezing. 07/21/18   Sharlene DoryWendling, Nicholas Paul, DO  Albuterol Sulfate (PROAIR HFA IN) Inhale into the lungs.    [provider]  aspirin 81 MG tablet Take 81 mg by mouth daily.    [provider]  atorvastatin (LIPITOR) 40 MG tablet Take 1 tablet (40 mg total) by mouth daily. 07/16/18   Sharlene DoryWendling, Nicholas Paul, DO  doxycycline (VIBRAMYCIN) 100 MG capsule Take 1 capsule (100 mg total) by mouth 2 (two) times daily. 03/24/19   Hall-Potvin, [place], PA-C  fluticasone (FLOVENT HFA) 110 MCG/ACT inhaler Take 2 puffs twice daily when you start feeling more short of breath. 10 day duration. 11/10/18   Sharlene DoryWendling, Nicholas Paul, DO  Fluticasone-Salmeterol (ADVAIR) 250-50 MCG/DOSE AEPB Inhale 1 puff into the lungs 2 (two) times daily. 07/07/18   Sharlene DoryWendling, Nicholas Paul, DO  Glycopyrrolate (SEEBRI NEOHALER) 15.6 MCG CAPS Place 2 puffs into inhaler and inhale 2 (two) times daily.    [provider]  levocetirizine (XYZAL) 5 MG tablet Take 1 tablet (5 mg total) by mouth every evening. 11/10/18   Sharlene DoryWendling, Nicholas Paul, DO  Melatonin 3 MG CAPS Take 3 mg by mouth at bedtime as needed.    [provider]  UNABLE TO FIND Prostate Defense    [provider]  Family History No family history on file.  Social History Social History   Tobacco Use  . Smoking status: Former Research scientist (life sciences)  . Smokeless tobacco: Never Used  Substance Use Topics  . Alcohol use: Yes    Comment: occ  . Drug use: No     Allergies   Patient has no known allergies.   Review of Systems Review of Systems  Constitutional: Negative for chills, diaphoresis, fatigue and fever.  HENT: Positive for congestion, postnasal drip and rhinorrhea. Negative for sneezing.   Eyes: Negative.   Respiratory: Positive for cough and shortness of breath. Negative for chest tightness.    Cardiovascular: Negative for chest pain and leg swelling.  Gastrointestinal: Negative for abdominal pain, blood in stool, diarrhea, nausea and vomiting.  Genitourinary: Negative for difficulty urinating, flank pain, frequency and hematuria.  Musculoskeletal: Negative for arthralgias and back pain.  Skin: Negative for rash.  Neurological: Negative for dizziness, speech difficulty, weakness, numbness and headaches.     Physical Exam Updated Vital Signs BP 124/70   Pulse 97   Temp 98.6 F (37 C) (Oral)   Resp (!) 23   Ht 5\' 6"  (1.676 m)   Wt 72.6 kg   SpO2 93%   BMI 25.82 kg/m   Physical Exam Constitutional:      Appearance: He is well-developed.  HENT:     Head: Normocephalic and atraumatic.     Right Ear: Tympanic membrane normal.     Left Ear: Tympanic membrane normal.  Eyes:     Pupils: Pupils are equal, round, and reactive to light.  Neck:     Musculoskeletal: Normal range of motion and neck supple.  Cardiovascular:     Rate and Rhythm: Normal rate and regular rhythm.     Heart sounds: Normal heart sounds.  Pulmonary:     Effort: Pulmonary effort is normal. No respiratory distress.     Breath sounds: Wheezing present. No rales.     Comments: Patient has good air movement bilaterally, there is scarce wheezing bilaterally Chest:     Chest wall: No tenderness.  Abdominal:     General: Bowel sounds are normal.     Palpations: Abdomen is soft.     Tenderness: There is no abdominal tenderness. There is no guarding or rebound.  Musculoskeletal: Normal range of motion.     Right lower leg: No edema.     Left lower leg: No edema.  Lymphadenopathy:     Cervical: No cervical adenopathy.  Skin:    General: Skin is warm and dry.     Findings: No rash.  Neurological:     Mental Status: He is alert and oriented to person, place, and time.      ED Treatments / Results  Labs (all labs ordered are listed, but only abnormal results are displayed) Labs Reviewed  BASIC  METABOLIC PANEL - Abnormal; Notable for the following components:      Result Value   CO2 19 (*)    Glucose, Bld 163 (*)    All other components within normal limits  CBC - Abnormal; Notable for the following components:   WBC 15.1 (*)    All other components within normal limits  BRAIN NATRIURETIC PEPTIDE  TROPONIN I    EKG EKG Interpretation  Date/Time:  Friday March 25 2019 11:38:41 EDT Ventricular Rate:  103 PR Interval:    QRS Duration: 71 QT Interval:  322 QTC Calculation: 422 R Axis:   53 Text Interpretation:  Sinus tachycardia  Low voltage, extremity leads since last tracing no significant change Confirmed by Rolan BuccoBelfi, Quetzaly Ebner [phone]([id number]) on 03/25/2019 12:38:19 PM   Radiology Dg Chest Portable 1 View  Result Date: 03/25/2019 CLINICAL DATA:  Shortness of breath. Sinus pressure and pain. Nasal congestion for 1 week. EXAM: PORTABLE CHEST 1 VIEW COMPARISON:  Two-view chest x-ray 10/07/2017 FINDINGS: Heart size is normal. Moderate pulmonary vascular congestion is exaggerated by low lung volumes. Chronic interstitial coarsening is present. There is mild bibasilar airspace disease. Atherosclerotic changes are noted at the aortic arch. IMPRESSION: 1. Low lung volumes with moderate pulmonary vascular congestion and mild bibasilar airspace disease. While this likely reflects atelectasis. Infection is not excluded. Electronically Signed   By: Marin Robertshristopher  Mattern M.D.   On: 03/25/2019 12:17    Procedures Procedures (including critical care time)  Medications Ordered in ED Medications - No data to display   Initial Impression / Assessment and Plan / ED Course  I have reviewed the triage vital signs and the nursing notes.  Pertinent labs & imaging results that were available during my care of the patient were reviewed by me and considered in my medical decision making (see chart for details).        Patient patient is a 73 year old male with a history of COPD who presents with URI  symptoms.  His main complaint is his nasal congestion and stuffiness.  He has had a couple episodes of vomiting but he says it is mostly mucus and I feel this is likely related to swallowing the mucus in the postnasal drip.  He is able to drink a full Sprite here without any nausea or vomiting.  His chest x-ray shows possible atelectasis versus infection.  He is maintaining normal oxygen saturation.  He has no increased work of breathing or tachypnea.  On my exam his respiratory rate was normal.  He has no signs of CHF or ACS.  He was discharged home in good condition.  He was started on doxycycline last night which he will continue.  He also has Claritin and Flonase to use for nasal congestion.  I encouraged him to follow-up with his PCP.  Return precautions were given.  I also discussed the findings with his daughter.  Patient does have a pulse oximeter at home and can monitor his oxygen saturations.  Eugene Daniels was evaluated in Emergency Department on 03/25/2019 for the symptoms described in the history of present illness. He was evaluated in the context of the global COVID-19 pandemic, which necessitated consideration that the patient might be at risk for infection with the SARS-CoV-2 virus that causes COVID-19. Institutional protocols and algorithms that pertain to the evaluation of patients at risk for COVID-19 are in a state of rapid change based on information released by regulatory bodies including the CDC and federal and state organizations. These policies and algorithms were followed during the patient's care in the ED.   Final Clinical Impressions(s) / ED Diagnoses   Final diagnoses:  Bronchitis    ED Discharge Orders    None       Rolan BuccoBelfi, Aleenah Homen, MD 03/25/19 1353    Rolan BuccoBelfi, Praise Stennett, MD 03/25/19 1353

## 2019-03-25 NOTE — ED Notes (Signed)
I spoke with Trenton Gammon, pts daughter, and gave her an update as to the plan of care at this point. She will call back in 45 minutes and speak to [facility] about an update.

## 2019-03-25 NOTE — ED Notes (Signed)
Patient on monitor with 30 BP

## 2019-07-11 DIAGNOSIS — J323 Chronic sphenoidal sinusitis: Secondary | ICD-10-CM | POA: Diagnosis not present

## 2020-06-09 IMAGING — DX PORTABLE CHEST - 1 VIEW
1 series · 1 of 1 positions shown · non-contrast
Comparison: Two-view chest x-ray 10/07/2017

CLINICAL DATA: Shortness of breath. Sinus pressure and pain. Nasal
congestion for 1 week.

EXAM:
PORTABLE CHEST 1 VIEW

[chest ap]
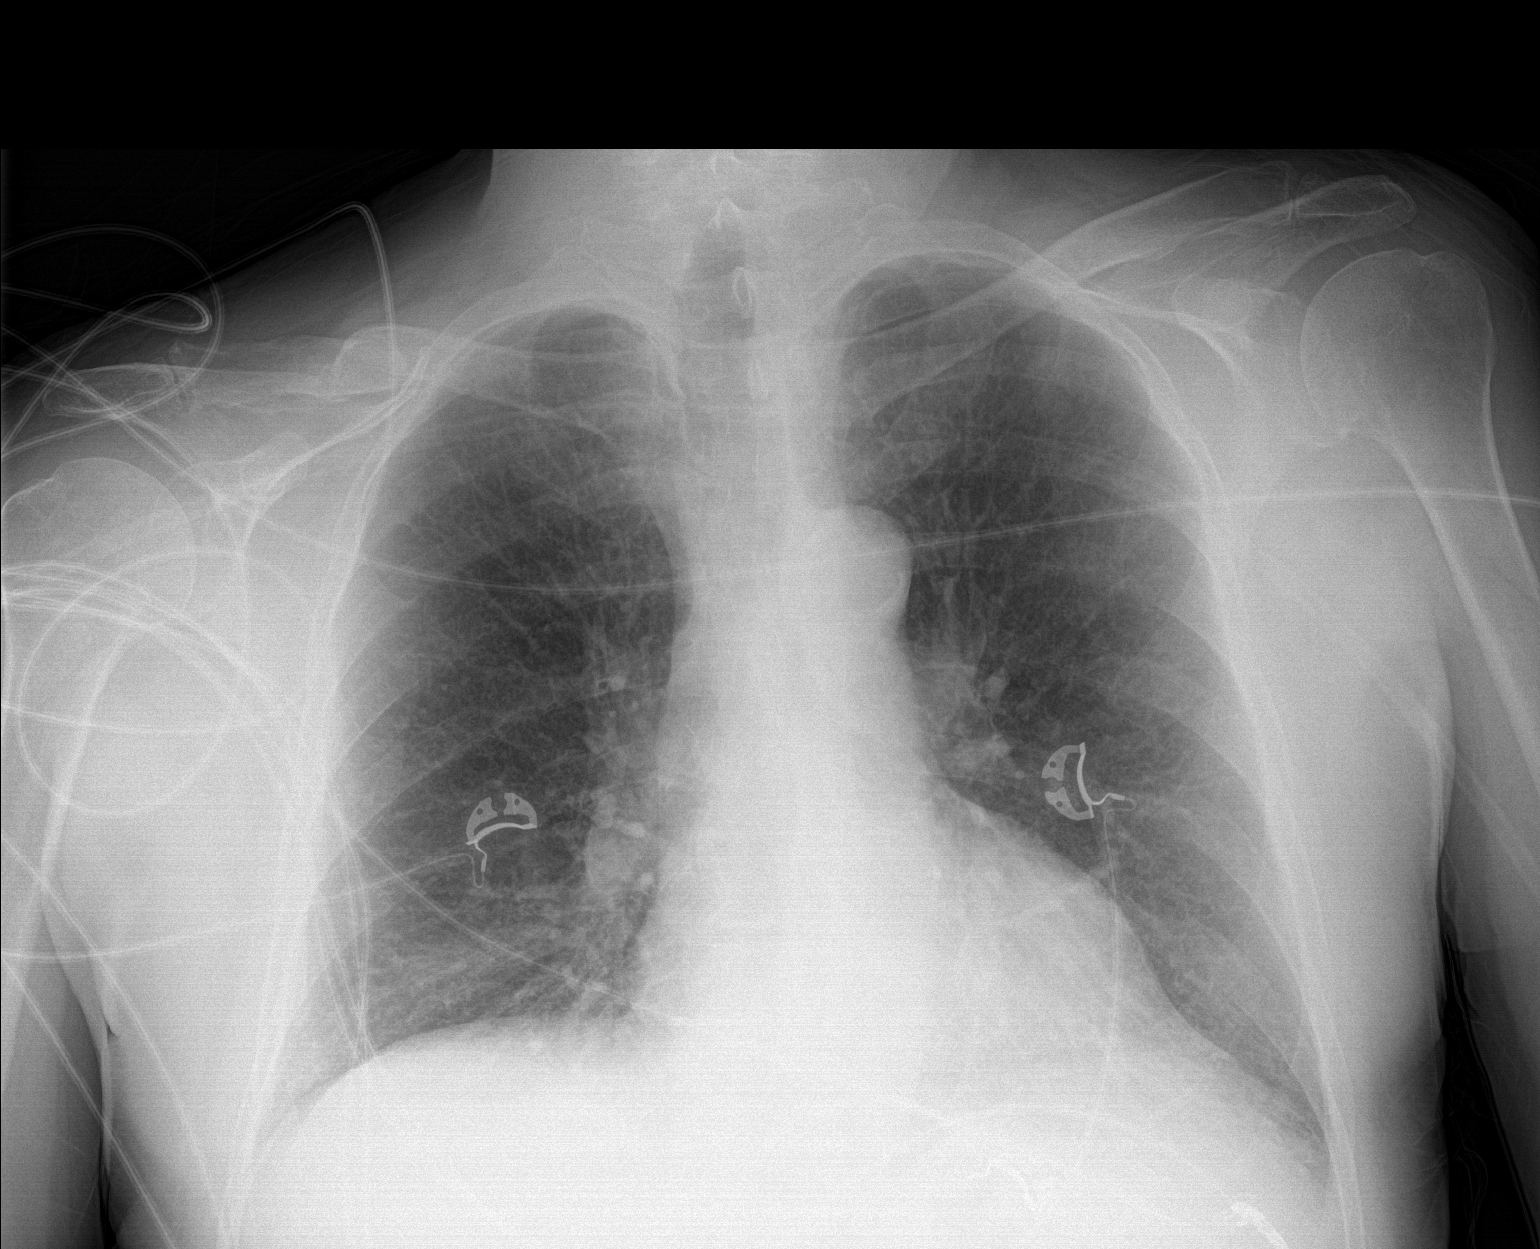

[1 of 1 positions shown; findings below may reference images not displayed]

FINDINGS: Heart size is normal. Moderate pulmonary vascular congestion is
exaggerated by low lung volumes. Chronic interstitial coarsening is
present. There is mild bibasilar airspace disease. Atherosclerotic
changes are noted at the aortic arch.
IMPRESSION: 1. Low lung volumes with moderate pulmonary vascular congestion and
mild bibasilar airspace disease. While this likely reflects
atelectasis. Infection is not excluded.

## 2020-09-03 DIAGNOSIS — R0902 Hypoxemia: Secondary | ICD-10-CM | POA: Diagnosis not present

## 2020-09-03 DIAGNOSIS — J441 Chronic obstructive pulmonary disease with (acute) exacerbation: Secondary | ICD-10-CM | POA: Diagnosis not present

## 2020-09-03 DIAGNOSIS — Z20822 Contact with and (suspected) exposure to covid-19: Secondary | ICD-10-CM | POA: Diagnosis not present

## 2020-09-03 DIAGNOSIS — R9431 Abnormal electrocardiogram [ECG] [EKG]: Secondary | ICD-10-CM | POA: Diagnosis not present

## 2020-09-03 DIAGNOSIS — M7989 Other specified soft tissue disorders: Secondary | ICD-10-CM | POA: Diagnosis not present

## 2020-09-03 DIAGNOSIS — M1990 Unspecified osteoarthritis, unspecified site: Secondary | ICD-10-CM | POA: Diagnosis not present

## 2020-09-03 DIAGNOSIS — R0602 Shortness of breath: Secondary | ICD-10-CM | POA: Diagnosis not present

## 2020-09-03 DIAGNOSIS — R0600 Dyspnea, unspecified: Secondary | ICD-10-CM | POA: Diagnosis not present

## 2020-09-03 DIAGNOSIS — R0689 Other abnormalities of breathing: Secondary | ICD-10-CM | POA: Diagnosis not present

## 2020-09-03 DIAGNOSIS — J45901 Unspecified asthma with (acute) exacerbation: Secondary | ICD-10-CM | POA: Diagnosis not present

## 2020-09-03 DIAGNOSIS — Z87891 Personal history of nicotine dependence: Secondary | ICD-10-CM | POA: Diagnosis not present

## 2020-09-03 DIAGNOSIS — E0500 Thyrotoxicosis with diffuse goiter without thyrotoxic crisis or storm: Secondary | ICD-10-CM | POA: Diagnosis not present

## 2020-09-03 DIAGNOSIS — E872 Acidosis: Secondary | ICD-10-CM | POA: Diagnosis not present

## 2020-09-03 DIAGNOSIS — J9690 Respiratory failure, unspecified, unspecified whether with hypoxia or hypercapnia: Secondary | ICD-10-CM | POA: Diagnosis not present

## 2020-09-03 DIAGNOSIS — R000 Tachycardia, unspecified: Secondary | ICD-10-CM | POA: Diagnosis not present

## 2020-09-03 DIAGNOSIS — R739 Hyperglycemia, unspecified: Secondary | ICD-10-CM | POA: Diagnosis not present

## 2020-09-03 DIAGNOSIS — E0590 Thyrotoxicosis, unspecified without thyrotoxic crisis or storm: Secondary | ICD-10-CM | POA: Diagnosis not present

## 2020-09-03 DIAGNOSIS — R7303 Prediabetes: Secondary | ICD-10-CM | POA: Diagnosis not present

## 2020-09-03 DIAGNOSIS — J9601 Acute respiratory failure with hypoxia: Secondary | ICD-10-CM | POA: Diagnosis not present

## 2020-09-03 DIAGNOSIS — R2232 Localized swelling, mass and lump, left upper limb: Secondary | ICD-10-CM | POA: Diagnosis not present

## 2020-09-03 DIAGNOSIS — R059 Cough, unspecified: Secondary | ICD-10-CM | POA: Diagnosis not present

## 2020-09-04 DIAGNOSIS — R739 Hyperglycemia, unspecified: Secondary | ICD-10-CM | POA: Diagnosis not present

## 2020-09-04 DIAGNOSIS — R0602 Shortness of breath: Secondary | ICD-10-CM | POA: Diagnosis not present

## 2020-09-04 DIAGNOSIS — J441 Chronic obstructive pulmonary disease with (acute) exacerbation: Secondary | ICD-10-CM | POA: Diagnosis not present

## 2020-09-04 DIAGNOSIS — R0600 Dyspnea, unspecified: Secondary | ICD-10-CM | POA: Diagnosis not present

## 2020-09-04 DIAGNOSIS — E0500 Thyrotoxicosis with diffuse goiter without thyrotoxic crisis or storm: Secondary | ICD-10-CM | POA: Diagnosis not present

## 2020-09-04 DIAGNOSIS — E872 Acidosis: Secondary | ICD-10-CM | POA: Diagnosis not present

## 2020-09-04 DIAGNOSIS — R7303 Prediabetes: Secondary | ICD-10-CM | POA: Diagnosis not present

## 2020-09-04 DIAGNOSIS — J9601 Acute respiratory failure with hypoxia: Secondary | ICD-10-CM | POA: Diagnosis not present

## 2020-09-05 DIAGNOSIS — J9601 Acute respiratory failure with hypoxia: Secondary | ICD-10-CM | POA: Diagnosis not present

## 2020-09-05 DIAGNOSIS — E0500 Thyrotoxicosis with diffuse goiter without thyrotoxic crisis or storm: Secondary | ICD-10-CM | POA: Diagnosis not present

## 2020-09-05 DIAGNOSIS — R739 Hyperglycemia, unspecified: Secondary | ICD-10-CM | POA: Diagnosis not present

## 2020-09-05 DIAGNOSIS — J441 Chronic obstructive pulmonary disease with (acute) exacerbation: Secondary | ICD-10-CM | POA: Diagnosis not present

## 2020-09-05 DIAGNOSIS — R7303 Prediabetes: Secondary | ICD-10-CM | POA: Diagnosis not present

## 2020-09-05 DIAGNOSIS — E872 Acidosis: Secondary | ICD-10-CM | POA: Diagnosis not present

## 2020-09-06 DIAGNOSIS — J441 Chronic obstructive pulmonary disease with (acute) exacerbation: Secondary | ICD-10-CM | POA: Diagnosis not present

## 2020-09-06 DIAGNOSIS — E872 Acidosis: Secondary | ICD-10-CM | POA: Diagnosis not present

## 2020-09-06 DIAGNOSIS — R7303 Prediabetes: Secondary | ICD-10-CM | POA: Diagnosis not present

## 2020-09-06 DIAGNOSIS — E0500 Thyrotoxicosis with diffuse goiter without thyrotoxic crisis or storm: Secondary | ICD-10-CM | POA: Diagnosis not present

## 2020-09-06 DIAGNOSIS — J9601 Acute respiratory failure with hypoxia: Secondary | ICD-10-CM | POA: Diagnosis not present

## 2020-09-07 DIAGNOSIS — E0500 Thyrotoxicosis with diffuse goiter without thyrotoxic crisis or storm: Secondary | ICD-10-CM | POA: Diagnosis not present

## 2020-09-07 DIAGNOSIS — R2232 Localized swelling, mass and lump, left upper limb: Secondary | ICD-10-CM | POA: Diagnosis not present

## 2020-09-07 DIAGNOSIS — E872 Acidosis: Secondary | ICD-10-CM | POA: Diagnosis not present

## 2020-09-07 DIAGNOSIS — J9601 Acute respiratory failure with hypoxia: Secondary | ICD-10-CM | POA: Diagnosis not present

## 2020-09-07 DIAGNOSIS — R739 Hyperglycemia, unspecified: Secondary | ICD-10-CM | POA: Diagnosis not present

## 2020-09-07 DIAGNOSIS — R7303 Prediabetes: Secondary | ICD-10-CM | POA: Diagnosis not present

## 2020-09-07 DIAGNOSIS — J441 Chronic obstructive pulmonary disease with (acute) exacerbation: Secondary | ICD-10-CM | POA: Diagnosis not present

## 2020-09-08 DIAGNOSIS — E0500 Thyrotoxicosis with diffuse goiter without thyrotoxic crisis or storm: Secondary | ICD-10-CM | POA: Diagnosis not present

## 2020-09-08 DIAGNOSIS — M7989 Other specified soft tissue disorders: Secondary | ICD-10-CM | POA: Diagnosis not present

## 2020-09-08 DIAGNOSIS — J441 Chronic obstructive pulmonary disease with (acute) exacerbation: Secondary | ICD-10-CM | POA: Diagnosis not present

## 2020-09-08 DIAGNOSIS — R7303 Prediabetes: Secondary | ICD-10-CM | POA: Diagnosis not present

## 2020-09-08 DIAGNOSIS — E872 Acidosis: Secondary | ICD-10-CM | POA: Diagnosis not present

## 2020-09-08 DIAGNOSIS — J9601 Acute respiratory failure with hypoxia: Secondary | ICD-10-CM | POA: Diagnosis not present

## 2020-09-09 DIAGNOSIS — M7989 Other specified soft tissue disorders: Secondary | ICD-10-CM | POA: Diagnosis not present

## 2020-09-09 DIAGNOSIS — R7303 Prediabetes: Secondary | ICD-10-CM | POA: Diagnosis not present

## 2020-09-09 DIAGNOSIS — E872 Acidosis: Secondary | ICD-10-CM | POA: Diagnosis not present

## 2020-09-09 DIAGNOSIS — J441 Chronic obstructive pulmonary disease with (acute) exacerbation: Secondary | ICD-10-CM | POA: Diagnosis not present

## 2020-09-09 DIAGNOSIS — J9601 Acute respiratory failure with hypoxia: Secondary | ICD-10-CM | POA: Diagnosis not present

## 2020-09-09 DIAGNOSIS — E0500 Thyrotoxicosis with diffuse goiter without thyrotoxic crisis or storm: Secondary | ICD-10-CM | POA: Diagnosis not present

## 2020-09-10 DIAGNOSIS — R000 Tachycardia, unspecified: Secondary | ICD-10-CM | POA: Diagnosis not present

## 2020-09-10 DIAGNOSIS — J441 Chronic obstructive pulmonary disease with (acute) exacerbation: Secondary | ICD-10-CM | POA: Diagnosis not present

## 2020-09-10 DIAGNOSIS — Z87891 Personal history of nicotine dependence: Secondary | ICD-10-CM | POA: Diagnosis not present

## 2020-09-10 DIAGNOSIS — Z9981 Dependence on supplemental oxygen: Secondary | ICD-10-CM | POA: Diagnosis not present

## 2020-09-13 DIAGNOSIS — J441 Chronic obstructive pulmonary disease with (acute) exacerbation: Secondary | ICD-10-CM | POA: Diagnosis not present

## 2020-09-13 DIAGNOSIS — Z9981 Dependence on supplemental oxygen: Secondary | ICD-10-CM | POA: Diagnosis not present

## 2020-09-13 DIAGNOSIS — Z87891 Personal history of nicotine dependence: Secondary | ICD-10-CM | POA: Diagnosis not present

## 2020-09-13 DIAGNOSIS — R000 Tachycardia, unspecified: Secondary | ICD-10-CM | POA: Diagnosis not present

## 2020-09-16 DIAGNOSIS — Z7951 Long term (current) use of inhaled steroids: Secondary | ICD-10-CM | POA: Diagnosis not present

## 2020-09-16 DIAGNOSIS — Z7952 Long term (current) use of systemic steroids: Secondary | ICD-10-CM | POA: Diagnosis not present

## 2020-09-16 DIAGNOSIS — Z9981 Dependence on supplemental oxygen: Secondary | ICD-10-CM | POA: Diagnosis not present

## 2020-09-16 DIAGNOSIS — E039 Hypothyroidism, unspecified: Secondary | ICD-10-CM | POA: Diagnosis not present

## 2020-09-16 DIAGNOSIS — E872 Acidosis: Secondary | ICD-10-CM | POA: Diagnosis not present

## 2020-09-16 DIAGNOSIS — J449 Chronic obstructive pulmonary disease, unspecified: Secondary | ICD-10-CM | POA: Diagnosis not present

## 2020-09-16 DIAGNOSIS — Z87891 Personal history of nicotine dependence: Secondary | ICD-10-CM | POA: Diagnosis not present

## 2020-09-16 DIAGNOSIS — J441 Chronic obstructive pulmonary disease with (acute) exacerbation: Secondary | ICD-10-CM | POA: Diagnosis not present

## 2020-09-16 DIAGNOSIS — R600 Localized edema: Secondary | ICD-10-CM | POA: Diagnosis not present

## 2020-09-16 DIAGNOSIS — M1990 Unspecified osteoarthritis, unspecified site: Secondary | ICD-10-CM | POA: Diagnosis not present

## 2020-09-16 DIAGNOSIS — E0500 Thyrotoxicosis with diffuse goiter without thyrotoxic crisis or storm: Secondary | ICD-10-CM | POA: Diagnosis not present

## 2020-09-16 DIAGNOSIS — R000 Tachycardia, unspecified: Secondary | ICD-10-CM | POA: Diagnosis not present

## 2020-09-16 DIAGNOSIS — R7303 Prediabetes: Secondary | ICD-10-CM | POA: Diagnosis not present

## 2020-09-19 DIAGNOSIS — E0500 Thyrotoxicosis with diffuse goiter without thyrotoxic crisis or storm: Secondary | ICD-10-CM | POA: Diagnosis not present

## 2020-09-19 DIAGNOSIS — J441 Chronic obstructive pulmonary disease with (acute) exacerbation: Secondary | ICD-10-CM | POA: Diagnosis not present

## 2020-09-19 DIAGNOSIS — M1990 Unspecified osteoarthritis, unspecified site: Secondary | ICD-10-CM | POA: Diagnosis not present

## 2020-09-19 DIAGNOSIS — Z7951 Long term (current) use of inhaled steroids: Secondary | ICD-10-CM | POA: Diagnosis not present

## 2020-09-19 DIAGNOSIS — E039 Hypothyroidism, unspecified: Secondary | ICD-10-CM | POA: Diagnosis not present

## 2020-09-19 DIAGNOSIS — R600 Localized edema: Secondary | ICD-10-CM | POA: Diagnosis not present

## 2020-09-19 DIAGNOSIS — R7303 Prediabetes: Secondary | ICD-10-CM | POA: Diagnosis not present

## 2020-09-19 DIAGNOSIS — Z7952 Long term (current) use of systemic steroids: Secondary | ICD-10-CM | POA: Diagnosis not present

## 2020-09-19 DIAGNOSIS — E872 Acidosis: Secondary | ICD-10-CM | POA: Diagnosis not present

## 2020-09-21 DIAGNOSIS — M1990 Unspecified osteoarthritis, unspecified site: Secondary | ICD-10-CM | POA: Diagnosis not present

## 2020-09-21 DIAGNOSIS — Z7952 Long term (current) use of systemic steroids: Secondary | ICD-10-CM | POA: Diagnosis not present

## 2020-09-21 DIAGNOSIS — Z7951 Long term (current) use of inhaled steroids: Secondary | ICD-10-CM | POA: Diagnosis not present

## 2020-09-21 DIAGNOSIS — E872 Acidosis: Secondary | ICD-10-CM | POA: Diagnosis not present

## 2020-09-21 DIAGNOSIS — J441 Chronic obstructive pulmonary disease with (acute) exacerbation: Secondary | ICD-10-CM | POA: Diagnosis not present

## 2020-09-21 DIAGNOSIS — R7303 Prediabetes: Secondary | ICD-10-CM | POA: Diagnosis not present

## 2020-09-21 DIAGNOSIS — E039 Hypothyroidism, unspecified: Secondary | ICD-10-CM | POA: Diagnosis not present

## 2020-09-21 DIAGNOSIS — E0500 Thyrotoxicosis with diffuse goiter without thyrotoxic crisis or storm: Secondary | ICD-10-CM | POA: Diagnosis not present

## 2020-09-21 DIAGNOSIS — R600 Localized edema: Secondary | ICD-10-CM | POA: Diagnosis not present

## 2020-09-25 DIAGNOSIS — M1990 Unspecified osteoarthritis, unspecified site: Secondary | ICD-10-CM | POA: Diagnosis not present

## 2020-09-25 DIAGNOSIS — E0500 Thyrotoxicosis with diffuse goiter without thyrotoxic crisis or storm: Secondary | ICD-10-CM | POA: Diagnosis not present

## 2020-09-25 DIAGNOSIS — J441 Chronic obstructive pulmonary disease with (acute) exacerbation: Secondary | ICD-10-CM | POA: Diagnosis not present

## 2020-09-25 DIAGNOSIS — Z7951 Long term (current) use of inhaled steroids: Secondary | ICD-10-CM | POA: Diagnosis not present

## 2020-09-25 DIAGNOSIS — E039 Hypothyroidism, unspecified: Secondary | ICD-10-CM | POA: Diagnosis not present

## 2020-09-25 DIAGNOSIS — R600 Localized edema: Secondary | ICD-10-CM | POA: Diagnosis not present

## 2020-09-25 DIAGNOSIS — E872 Acidosis: Secondary | ICD-10-CM | POA: Diagnosis not present

## 2020-09-25 DIAGNOSIS — Z7952 Long term (current) use of systemic steroids: Secondary | ICD-10-CM | POA: Diagnosis not present

## 2020-09-25 DIAGNOSIS — R7303 Prediabetes: Secondary | ICD-10-CM | POA: Diagnosis not present

## 2020-09-28 DIAGNOSIS — R600 Localized edema: Secondary | ICD-10-CM | POA: Diagnosis not present

## 2020-09-28 DIAGNOSIS — J441 Chronic obstructive pulmonary disease with (acute) exacerbation: Secondary | ICD-10-CM | POA: Diagnosis not present

## 2020-09-28 DIAGNOSIS — Z7952 Long term (current) use of systemic steroids: Secondary | ICD-10-CM | POA: Diagnosis not present

## 2020-09-28 DIAGNOSIS — E039 Hypothyroidism, unspecified: Secondary | ICD-10-CM | POA: Diagnosis not present

## 2020-09-28 DIAGNOSIS — E0500 Thyrotoxicosis with diffuse goiter without thyrotoxic crisis or storm: Secondary | ICD-10-CM | POA: Diagnosis not present

## 2020-09-28 DIAGNOSIS — M1990 Unspecified osteoarthritis, unspecified site: Secondary | ICD-10-CM | POA: Diagnosis not present

## 2020-09-28 DIAGNOSIS — E872 Acidosis: Secondary | ICD-10-CM | POA: Diagnosis not present

## 2020-09-28 DIAGNOSIS — Z7951 Long term (current) use of inhaled steroids: Secondary | ICD-10-CM | POA: Diagnosis not present

## 2020-09-28 DIAGNOSIS — R7303 Prediabetes: Secondary | ICD-10-CM | POA: Diagnosis not present

## 2020-10-05 DIAGNOSIS — J441 Chronic obstructive pulmonary disease with (acute) exacerbation: Secondary | ICD-10-CM | POA: Diagnosis not present

## 2020-10-05 DIAGNOSIS — E0500 Thyrotoxicosis with diffuse goiter without thyrotoxic crisis or storm: Secondary | ICD-10-CM | POA: Diagnosis not present

## 2020-10-05 DIAGNOSIS — E872 Acidosis: Secondary | ICD-10-CM | POA: Diagnosis not present

## 2020-10-05 DIAGNOSIS — M1990 Unspecified osteoarthritis, unspecified site: Secondary | ICD-10-CM | POA: Diagnosis not present

## 2020-10-05 DIAGNOSIS — E039 Hypothyroidism, unspecified: Secondary | ICD-10-CM | POA: Diagnosis not present

## 2020-10-05 DIAGNOSIS — R600 Localized edema: Secondary | ICD-10-CM | POA: Diagnosis not present

## 2020-10-05 DIAGNOSIS — Z7952 Long term (current) use of systemic steroids: Secondary | ICD-10-CM | POA: Diagnosis not present

## 2020-10-05 DIAGNOSIS — Z7951 Long term (current) use of inhaled steroids: Secondary | ICD-10-CM | POA: Diagnosis not present

## 2020-10-05 DIAGNOSIS — R7303 Prediabetes: Secondary | ICD-10-CM | POA: Diagnosis not present

## 2020-10-06 ENCOUNTER — Encounter (HOSPITAL_BASED_OUTPATIENT_CLINIC_OR_DEPARTMENT_OTHER): Payer: Self-pay | Admitting: *Deleted

## 2020-10-06 ENCOUNTER — Emergency Department (HOSPITAL_BASED_OUTPATIENT_CLINIC_OR_DEPARTMENT_OTHER)
Admission: EM | Admit: 2020-10-06 | Discharge: 2020-10-06 | Disposition: A | Discharge status: Home / Self Care | Payer: Medicare HMO | Attending: Emergency Medicine | Admitting: Emergency Medicine

## 2020-10-06 ENCOUNTER — Other Ambulatory Visit: Payer: Self-pay

## 2020-10-06 DIAGNOSIS — Z7951 Long term (current) use of inhaled steroids: Secondary | ICD-10-CM | POA: Insufficient documentation

## 2020-10-06 DIAGNOSIS — Z87891 Personal history of nicotine dependence: Secondary | ICD-10-CM | POA: Insufficient documentation

## 2020-10-06 DIAGNOSIS — Z20822 Contact with and (suspected) exposure to covid-19: Secondary | ICD-10-CM | POA: Diagnosis not present

## 2020-10-06 DIAGNOSIS — J449 Chronic obstructive pulmonary disease, unspecified: Secondary | ICD-10-CM | POA: Insufficient documentation

## 2020-10-06 LAB — RESP PANEL BY RT-PCR (RSV, FLU A&B, COVID)  RVPGX2
Influenza A by PCR: NEGATIVE
Influenza B by PCR: NEGATIVE
Resp Syncytial Virus by PCR: NEGATIVE
SARS Coronavirus 2 by RT PCR: NEGATIVE

## 2020-10-06 NOTE — Discharge Instructions (Signed)
Your Covid test is negative and you have completed your quarantine and you are fully vaccinated.  Therefore you can resume normal activities.   See your doctor for follow-up  Return to ER if you have fever cough, shortness of breath.

## 2020-10-06 NOTE — ED Triage Notes (Signed)
Tested for Covid at home this morning but not sure if the result is positive.  Son in law is positive for covid a week ago.  Hx of COPD.  Uses Oxygen PRN

## 2020-10-06 NOTE — ED Provider Notes (Signed)
[facility] EMERGENCY DEPARTMENT Provider Note   CSN: [id number] Arrival date & time: 10/06/20  1352     History Chief Complaint  Patient presents with  . Covid Exposure    Eugene Daniels is a 74 y.o. male hx of COPD, here with COVID test. Patient lives with son in law, who is positive for COVID about a week ago.  Patient tested negative at that time.  Patient has no symptoms and is fully vaccinated.  Patient in particular did not have any fevers or chills or cough.  Patient tested himself this morning it was unclear if his antigen test was positive or negative.  He was to get a confirmatory study before he goes and visits his family.  His Covid test is negative in triage.  The history is provided by the patient.       Past Medical History:  Diagnosis Date  . Arthritis   . COPD (chronic obstructive pulmonary disease) (HCC)   . ED (erectile dysfunction)   . Enlarged prostate   . Hyperlipidemia     Patient Active Problem List   Diagnosis Date Noted  . Hyperlipidemia 11/10/2018  . Seasonal allergies 11/10/2018  . Benign prostatic hyperplasia with urinary frequency 07/07/2018  . PAD (peripheral artery disease) (HCC) 07/07/2018  . RLS (restless legs syndrome) 07/07/2018  . Onychomycosis 07/07/2018  . Chronic obstructive pulmonary disease (HCC) 07/07/2018    History reviewed. No pertinent surgical history.     History reviewed. No pertinent family history.  Social History   Tobacco Use  . Smoking status: Former Games developer  . Smokeless tobacco: Never Used  Substance Use Topics  . Alcohol use: Not Currently    Comment: occ  . Drug use: No    Home Medications Prior to Admission medications   Medication Sig Start Date End Date Taking? Authorizing Provider  albuterol (ACCUNEB) 0.63 MG/3ML nebulizer solution Take 3 mLs (0.63 mg total) by nebulization every 6 (six) hours as needed for wheezing. 07/21/18   Sharlene Dory, DO  Albuterol Sulfate (PROAIR  HFA IN) Inhale into the lungs.    [provider]  aspirin 81 MG tablet Take 81 mg by mouth daily.    [provider]  atorvastatin (LIPITOR) 40 MG tablet Take 1 tablet (40 mg total) by mouth daily. 07/16/18   Sharlene Dory, DO  doxycycline (VIBRAMYCIN) 100 MG capsule Take 1 capsule (100 mg total) by mouth 2 (two) times daily. 03/24/19   Hall-Potvin, [place], PA-C  fluticasone (FLOVENT HFA) 110 MCG/ACT inhaler Take 2 puffs twice daily when you start feeling more short of breath. 10 day duration. 11/10/18   Sharlene Dory, DO  Fluticasone-Salmeterol (ADVAIR) 250-50 MCG/DOSE AEPB Inhale 1 puff into the lungs 2 (two) times daily. 07/07/18   Sharlene Dory, DO  Glycopyrrolate (SEEBRI NEOHALER) 15.6 MCG CAPS Place 2 puffs into inhaler and inhale 2 (two) times daily.    [provider]  levocetirizine (XYZAL) 5 MG tablet Take 1 tablet (5 mg total) by mouth every evening. 11/10/18   Sharlene Dory, DO  Melatonin 3 MG CAPS Take 3 mg by mouth at bedtime as needed.    [provider]  UNABLE TO FIND Prostate Defense    [provider]    Allergies    Patient has no known allergies.  Review of Systems   Review of Systems  Constitutional: Negative for fever.  Respiratory: Negative for cough.   All other systems reviewed and are negative.  Physical Exam Updated Vital Signs BP 135/78 (BP Location: Right Arm)   Pulse 75   Temp 98.1 F (36.7 C) (Oral)   Resp 16   Ht 5\' 6"  (1.676 m)   Wt 77.6 kg   SpO2 96%   BMI 27.60 kg/m   Physical Exam Vitals and nursing note reviewed.  Constitutional:      Appearance: Normal appearance.  HENT:     Head: Normocephalic.     Nose: Nose normal.     Mouth/Throat:     Mouth: Mucous membranes are moist.  Eyes:     Extraocular Movements: Extraocular movements intact.     Pupils: Pupils are equal, round, and reactive to light.  Cardiovascular:     Rate and Rhythm: Normal rate and  regular rhythm.     Pulses: Normal pulses.     Heart sounds: Normal heart sounds.  Pulmonary:     Effort: Pulmonary effort is normal.     Breath sounds: Normal breath sounds.  Abdominal:     General: Abdomen is flat.     Palpations: Abdomen is soft.  Musculoskeletal:        General: Normal range of motion.     Cervical back: Normal range of motion and neck supple.  Skin:    General: Skin is warm.     Capillary Refill: Capillary refill takes less than 2 seconds.  Neurological:     General: No focal deficit present.     Mental Status: He is alert.  Psychiatric:        Mood and Affect: Mood normal.        Thought Content: Thought content normal.     ED Results / Procedures / Treatments   Labs (all labs ordered are listed, but only abnormal results are displayed) Labs Reviewed  RESP PANEL BY RT-PCR (RSV, FLU A&B, COVID)  [id number]    EKG None  Radiology No results found.  Procedures Procedures (including critical care time)  Medications Ordered in ED Medications - No data to display  ED Course  I have reviewed the triage vital signs and the nursing notes.  Pertinent labs & imaging results that were available during my care of the patient were reviewed by me and considered in my medical decision making (see chart for details).    MDM Rules/Calculators/A&P                          Eugene Daniels is a 74 y.o. male here requesting a Covid test.  His Covid test and flu and RSV is negative.  Patient has no symptoms and fully vaccinated.  He is already quarantine for about 10 days since his son-in-law was positive. At this point, he can resume normal activities   Final Clinical Impression(s) / ED Diagnoses Final diagnoses:  None    Rx / DC Orders ED Discharge Orders    None       61, MD 10/06/20 1558

## 2020-10-06 NOTE — ED Notes (Signed)
ED Provider at bedside. 

## 2020-10-08 DIAGNOSIS — E0500 Thyrotoxicosis with diffuse goiter without thyrotoxic crisis or storm: Secondary | ICD-10-CM | POA: Diagnosis not present

## 2020-10-08 DIAGNOSIS — M1990 Unspecified osteoarthritis, unspecified site: Secondary | ICD-10-CM | POA: Diagnosis not present

## 2020-10-08 DIAGNOSIS — R7303 Prediabetes: Secondary | ICD-10-CM | POA: Diagnosis not present

## 2020-10-08 DIAGNOSIS — Z7952 Long term (current) use of systemic steroids: Secondary | ICD-10-CM | POA: Diagnosis not present

## 2020-10-08 DIAGNOSIS — E039 Hypothyroidism, unspecified: Secondary | ICD-10-CM | POA: Diagnosis not present

## 2020-10-08 DIAGNOSIS — R600 Localized edema: Secondary | ICD-10-CM | POA: Diagnosis not present

## 2020-10-08 DIAGNOSIS — J441 Chronic obstructive pulmonary disease with (acute) exacerbation: Secondary | ICD-10-CM | POA: Diagnosis not present

## 2020-10-08 DIAGNOSIS — Z7951 Long term (current) use of inhaled steroids: Secondary | ICD-10-CM | POA: Diagnosis not present

## 2020-10-08 DIAGNOSIS — E872 Acidosis: Secondary | ICD-10-CM | POA: Diagnosis not present

## 2021-07-11 ENCOUNTER — Telehealth: Payer: Self-pay | Admitting: Family Medicine

## 2021-07-11 NOTE — Telephone Encounter (Signed)
Zoe from [employer] is calling in regards of a prescription she sent for a portable oxygen concentrator for the patient. She states that the patient is currently with oxygen at home and would like a portable one to be able to leave his house. She stated she had tried to do it though the [place] since he goes there for his overall health but they have not been able to help her. She had faxed over the prescription to [place] and wanted an update. She was informed that the patient has not been here since Jan of 2020, and she stated that the patient would be more than willing to come in and see Wendling. She can be reached at [phone]. Please advice.

## 2021-07-11 NOTE — Telephone Encounter (Signed)
Called Eugene Daniels back left detailed message patient is seen by a specialist and order needs to go to that office. Informed again that we have not seen the patient since 10/2018

## 2021-07-11 NOTE — Telephone Encounter (Signed)
Also did receive a fax request Faxed back to [facility] with note attached to send to specialist seeing this patient/PCP has not seen him since 10/2018.

## 2021-08-14 ENCOUNTER — Telehealth: Payer: Self-pay | Admitting: Pulmonary Disease

## 2021-08-14 NOTE — Telephone Encounter (Signed)
Patient scheduled 08/21/21 with Dr. Judeth Horn for a COPD consult.  Patient was referred by [facility].  No papers have been received from [place] at this time.   I called Patient as a consult reminder and asked about any labs within the last year.  Patient stated he had labs drawn at the [place] .  Patient stated he was going to have labs, test reports, notes faxed to office.  Main fax number given.

## 2021-08-15 ENCOUNTER — Institutional Professional Consult (permissible substitution): Payer: No Typology Code available for payment source | Admitting: Pulmonary Disease

## 2021-08-15 ENCOUNTER — Telehealth: Payer: Self-pay | Admitting: Pulmonary Disease

## 2021-08-15 NOTE — Telephone Encounter (Signed)
Patient scheduled COPD consult 08/21/21,referred from [place].  I spoke with Patient about having labs, records faxed to office for Provider.  [facility] Main [facility] number given.   I had reach out about any CT scans, cxr Patient may have had.  CTA 09/04/20 uploaded to PACS.

## 2021-08-20 ENCOUNTER — Telehealth: Payer: Self-pay | Admitting: Pulmonary Disease

## 2021-08-20 NOTE — Telephone Encounter (Signed)
Nope I have not received this my fax number is  [phone]

## 2021-08-20 NOTE — Telephone Encounter (Signed)
Called [employer] and spoke with Zoe and provided her with Chan's fax number for the form to be faxed to. Routing to [place] as an FYI to be on a lookout for the form.

## 2021-08-20 NOTE — Telephone Encounter (Signed)
Johny Drilling please advise if you received the fax from [facility] on the poc   thanks

## 2021-08-21 ENCOUNTER — Ambulatory Visit (INDEPENDENT_AMBULATORY_CARE_PROVIDER_SITE_OTHER): Payer: No Typology Code available for payment source | Admitting: Pulmonary Disease

## 2021-08-21 ENCOUNTER — Encounter: Payer: Self-pay | Admitting: Pulmonary Disease

## 2021-08-21 ENCOUNTER — Other Ambulatory Visit: Payer: Self-pay

## 2021-08-21 VITALS — BP 124/70 | HR 66 | Temp 98.0°F | Ht 66.0 in | Wt 178.2 lb

## 2021-08-21 DIAGNOSIS — J449 Chronic obstructive pulmonary disease, unspecified: Secondary | ICD-10-CM | POA: Diagnosis not present

## 2021-08-21 NOTE — Telephone Encounter (Signed)
I have taken the form to Dr [facility] nurse but this patient has not been seen by [place] as of yet they are scheduled for today

## 2021-08-21 NOTE — Patient Instructions (Signed)
Nice to meet you  For the breathing I recommend:  Continue Breo 1 puff one a day - rinse mouth after every use  Continue Spiriva 2 puffs once a day   Continue albuterol nebulizer AS NEEDED   Stop Budesonide nebulizer as the Breo has similar medication  We will walk you today and see about needing oxygen for re-certification and if a portable concentrator from [employer] is an option.  Return to clinic in 3 months or sooner as needed

## 2021-08-21 NOTE — Progress Notes (Signed)
@Patient  ID: , male    DOB: [birth year], 75 y.o.   MRN: [id number]  Chief Complaint  Patient presents with   Consult    Pt states referred for COPD, Asthma, emphysema.     Referring provider: [id number], *  HPI:   75 y.o. whom we are seeing in consultation for evaluation of COPD and emphysema.  Note from referring provider reviewed.  Patient referred for ongoing treatment of COPD, asthma.  Previously followed elsewhere in pulmonary office.  He reports COPD in the past based on the breathing test.  I cannot review the results of this test.  Overall, he seems to be doing relatively okay.  Sound like he had recurrent exacerbations many years ago while he was still smoking.  Serious 1 in the 76s that led to him moving to 72 with his daughter from [place].  Originally from [place]. There are multiple duplicate medications list list.  A bit confused about what he is using.  Prescribed Breo and Spiriva as well as budesonide nebs.  He is not really doing the budesonide nebs currently.  Said that was placed on his chart with recent at 1 point time due to going to the ED or hospitalization.  He cannot recall.  He has baseline dyspnea.  No better or worse.  No time of day to things otherwise.  No position makes it better or worse.  No seasonal environmental factors identified to make things better or worse.  He does think the inhaler therapy helps some.  When he misses doses occasionally he notes his breathing is worse.  He is on Dupixent for nasal polyps, nasal issues.  Does not notice any difference in his breathing since starting the Dupixent.  He has nocturnal oxygen.  At 4L, then 2 L, currently 2 L.  He was told to decrease by his pulmonary doctor in the past.  He reports good adherence to this.  He is interested in a portable oxygen concentrator.  He does not need oxygen during the day it is documented.  He occasionally puts it on during the day to help with his  dyspnea.  No documented oxygen saturation with exertion or at rest.  Plan to walk around the office today to further evaluate.  PMH: Tobacco abuse in remission, reported COPD, seasonal allergies, nasal polyps Surgical history:History reviewed. No pertinent surgical history. Family history:History reviewed. No pertinent family history. Social history: Former smoker, 40-pack-year, quit around 2009 for GERD with multiple prior durations of absence of cigarette smoke, from lived in 2010, moved to [place] after COPD "attack" in the 90s.  Lives with daughter    Questionaires / Pulmonary Flowsheets:   ACT:  No flowsheet data found.  MMRC: No flowsheet data found.  Epworth:  No flowsheet data found.  Tests:   FENO:  No results found for: NITRICOXIDE  PFT: No flowsheet data found.  WALK:  SIX MIN WALK 08/21/2021  Tech Comments: pt walked at moderate pace, pt felt chest tightness and dizziness L2    Imaging: Personally reviewed and as per EMR discussion this note  Lab Results: Personally reviewed and as per EMR CBC    Component Value Date/Time   WBC 15.1 (H) 03/25/2019 1145   RBC 5.31 03/25/2019 1145   HGB 15.9 03/25/2019 1145   HCT 48.3 03/25/2019 1145   PLT 253 03/25/2019 1145   MCV 91.0 03/25/2019 1145   MCH 29.9 03/25/2019 1145   MCHC 32.9 03/25/2019 1145  RDW 13.2 03/25/2019 1145   LYMPHSABS 2.3 10/07/2017 1850   MONOABS 0.9 10/07/2017 1850   EOSABS 1.0 (H) 10/07/2017 1850   BASOSABS 0.1 10/07/2017 1850    BMET    Component Value Date/Time   NA 138 03/25/2019 1145   K 3.9 03/25/2019 1145   CL 108 03/25/2019 1145   CO2 19 (L) 03/25/2019 1145   GLUCOSE 163 (H) 03/25/2019 1145   BUN 10 03/25/2019 1145   CREATININE 0.94 03/25/2019 1145   CALCIUM 9.2 03/25/2019 1145   GFRNONAA >60 03/25/2019 1145   GFRAA >60 03/25/2019 1145    BNP    Component Value Date/Time   BNP 31.1 03/25/2019 1145    ProBNP No results found for: PROBNP  Specialty  Problems       Pulmonary Problems   Chronic obstructive pulmonary disease (HCC)    No Known Allergies  Immunization History  Administered Date(s) Administered   Influenza, High Dose Seasonal PF 08/06/2018   Pneumococcal Conjugate-13 08/06/2018   Tdap 08/06/2018    Past Medical History:  Diagnosis Date   Arthritis    COPD (chronic obstructive pulmonary disease) (HCC)    ED (erectile dysfunction)    Enlarged prostate    Hyperlipidemia     Tobacco History: Social History   Tobacco Use  Smoking Status Former   Packs/day: 1.00   Years: 30.00   Pack years: 30.00   Types: Cigarettes  Smokeless Tobacco Never  Tobacco Comments   Quit 2010   Counseling given: Not Answered Tobacco comments: Quit 2010   Continue to not smoke  Outpatient Encounter Medications as of 08/21/2021  Medication Sig   albuterol (ACCUNEB) 0.63 MG/3ML nebulizer solution Take 3 mLs (0.63 mg total) by nebulization every 6 (six) hours as needed for wheezing.   Albuterol Sulfate (PROAIR HFA IN) Inhale into the lungs.   aspirin 81 MG tablet Take 81 mg by mouth daily.   atorvastatin (LIPITOR) 40 MG tablet Take 1 tablet (40 mg total) by mouth daily.   azelastine (ASTELIN) 0.1 % nasal spray Place 2 sprays into both nostrils daily.   budesonide (PULMICORT) 0.5 MG/2ML nebulizer solution INHALE 1 VIAL TWICE A DAY (MIX WITH SALINE AND IRRIGATE SINUSES)   diltiazem (CARDIZEM) 120 MG tablet Take by mouth.   dupilumab (DUPIXENT) 300 MG/2ML prefilled syringe INJECT 1 PREFILLED SYRINGE FOR A 300MG  DOSE SUBCUTANEOUSLY EVERY 14 DAYS DO NOT MAIL --- FOR CLINIC ADMINISTRATION ONLY   EPINEPHrine 0.3 mg/0.3 mL IJ SOAJ injection INJECT 0.3 ML INTRAMUSCULARLY ONCE AS NEEDED   fluticasone furoate-vilanterol (BREO ELLIPTA) 200-25 MCG/ACT AEPB INHALE 1 PUFF BY MOUTH IN THE MORNING   Glycopyrrolate 15.6 MCG CAPS Place 2 puffs into inhaler and inhale 2 (two) times daily.   ipratropium (ATROVENT) 0.02 % nebulizer solution Use 1  VIAL IN NEBULIZER Q6H (3,9)   levocetirizine (XYZAL) 5 MG tablet Take 1 tablet (5 mg total) by mouth every evening.   Melatonin 3 MG CAPS Take 3 mg by mouth at bedtime as needed.   methimazole (TAPAZOLE) 10 MG tablet Take by mouth.   sertraline (ZOLOFT) 50 MG tablet TAKE ONE-HALF TABLET BY MOUTH IN THE MORNING FOR MENTAL HEALTH   Tiotropium Bromide Monohydrate 2.5 MCG/ACT AERS INHALE 2 PUFFS BY MOUTH ONCE A DAY   [DISCONTINUED] doxycycline (VIBRAMYCIN) 100 MG capsule Take 1 capsule (100 mg total) by mouth 2 (two) times daily.   [DISCONTINUED] Fluticasone-Salmeterol (ADVAIR) 250-50 MCG/DOSE AEPB Inhale 1 puff into the lungs 2 (two) times daily.   [DISCONTINUED]  Sodium Chloride Flush (NORMAL SALINE FLUSH) 0.9 % SOLN INJECT IV PUSH AS NEEDED FOR FLUSH.   UNABLE TO FIND Prostate Defense   [DISCONTINUED] fluticasone (FLOVENT HFA) 110 MCG/ACT inhaler Take 2 puffs twice daily when you start feeling more short of breath. 10 day duration.   No facility-administered encounter medications on file as of 08/21/2021.     Review of Systems  Review of Systems  No chest pain with exertion.  No orthopnea PND.  No lower extremity swelling.  Comprehensive review of systems otherwise negative. Physical Exam  BP 124/70 (BP Location: Left Arm, Patient Position: Sitting, Cuff Size: Normal)   Pulse 66   Temp 98 F (36.7 C) (Oral)   Ht 5\' 6"  (1.676 m)   Wt 178 lb 3.2 oz (80.8 kg)   SpO2 96%   BMI 28.76 kg/m   Wt Readings from Last 5 Encounters:  08/21/21 178 lb 3.2 oz (80.8 kg)  10/06/20 171 lb (77.6 kg)  03/25/19 160 lb (72.6 kg)  11/10/18 167 lb 8 oz (76 kg)  08/06/18 166 lb 6 oz (75.5 kg)    BMI Readings from Last 5 Encounters:  08/21/21 28.76 kg/m  10/06/20 27.60 kg/m  03/25/19 25.82 kg/m  11/10/18 27.04 kg/m  08/06/18 26.85 kg/m     Physical Exam General: Sitting in chair, no acute distress Eyes: EOMI, no icterus Neck: Supple, there is no JVP appreciated Pulmonary: Clear,  normal work of breathing Cardiovascular: Regular rate and rhythm, no murmur Abdomen: Nondistended, bowel sounds present MSK: No synovitis, no joint effusion Neuro: Normal gait, no weakness Psych: Normal mood, full affect   Assessment & Plan:   Reports COPD (no PFTs in review), emphysema, asthma: Rare albuterol use.  Baseline dyspnea stable.  Multiple conflicting medications.  Instructed to use Breo with Spiriva once daily.  Albuterol as needed.  Stop budesonide.  Nocturnal hypoxemia: With home nocturnal oxygen use.  He reports good adherence to this.  Important he continue this.  Recommend he continue 2 L at night.  Walked around clinic today, no desaturation with exertion.  Consider 6-minute walk test in the future.   Return in about 3 months (around 11/21/2021).   01/19/2022, MD 08/21/2021

## 2021-08-22 NOTE — Telephone Encounter (Signed)
Dr Judeth Horn did not sign the CMN he stated did not desaurate not appropiate

## 2021-11-21 ENCOUNTER — Ambulatory Visit: Payer: Medicare Other | Admitting: Pulmonary Disease

## 2021-11-26 ENCOUNTER — Ambulatory Visit (INDEPENDENT_AMBULATORY_CARE_PROVIDER_SITE_OTHER): Payer: No Typology Code available for payment source | Admitting: Pulmonary Disease

## 2021-11-26 ENCOUNTER — Encounter: Payer: Self-pay | Admitting: Pulmonary Disease

## 2021-11-26 ENCOUNTER — Other Ambulatory Visit: Payer: Self-pay

## 2021-11-26 VITALS — BP 138/76 | HR 76 | Temp 98.7°F | Ht 66.0 in | Wt 193.0 lb

## 2021-11-26 DIAGNOSIS — J449 Chronic obstructive pulmonary disease, unspecified: Secondary | ICD-10-CM | POA: Diagnosis not present

## 2021-11-26 NOTE — Patient Instructions (Signed)
Nice to see you again  No medication changes   Return in 6 months or sooner as needed

## 2021-11-27 NOTE — Progress Notes (Signed)
@Patient  ID: Eugene Daniels, male    DOB: 1945-11-12, 76 y.o.   MRN: [id number]  Chief Complaint  Patient presents with   Follow-up    3 month follow up. Pt states he is doing fine since last visit. Pt states he uses his Breo inhaler and his neb medication     Referring provider: No ref. provider found  HPI:   76 y.o. whom we are seeing in follow-up for evaluation of COPD and emphysema.    Returns to clinic for routine follow-up.  Overall doing fine.  Reports good adherence to daily Breo and Spiriva Respimat.  Thinks is helped his breathing some.  Slight decrease in need for rescue inhaler.  He is using this daily sometimes twice a day.  Unclear if due to symptoms or just as part of habit.  Either way, symptoms relatively well controlled.  No prednisone or exacerbations since last visit 3 months prior.  HPI initial visit: Patient referred for ongoing treatment of COPD, asthma.  Previously followed elsewhere in pulmonary office.  He reports COPD in the past based on the breathing test.  I cannot review the results of this test.  Overall, he seems to be doing relatively okay.  Sound like he had recurrent exacerbations many years ago while he was still smoking.  Serious 1 in the 90s that led to him moving to [place] with his daughter from [place].  Originally from [place]. There are multiple duplicate medications list list.  A bit confused about what he is using.  Prescribed Breo and Spiriva as well as budesonide nebs.  He is not really doing the budesonide nebs currently.  Said that was placed on his chart with recent at 1 point time due to going to the ED or hospitalization.  He cannot recall.  He has baseline dyspnea.  No better or worse.  No time of day to things otherwise.  No position makes it better or worse.  No seasonal environmental factors identified to make things better or worse.  He does think the inhaler therapy helps some.  When he misses doses occasionally he notes his  breathing is worse.  He is on Dupixent for nasal polyps, nasal issues.  Does not notice any difference in his breathing since starting the Dupixent.  He has nocturnal oxygen.  At 4L, then 2 L, currently 2 L.  He was told to decrease by his pulmonary doctor in the past.  He reports good adherence to this.  He is interested in a portable oxygen concentrator.  He does not need oxygen during the day it is documented.  He occasionally puts it on during the day to help with his dyspnea.  No documented oxygen saturation with exertion or at rest.  Plan to walk around the office today to further evaluate.  PMH: Tobacco abuse in remission, reported COPD, seasonal allergies, nasal polyps Surgical history:History reviewed. No pertinent surgical history. Family history:History reviewed. No pertinent family history. Social history: Former smoker, 40-pack-year, quit around 2009 for GERD with multiple prior durations of absence of cigarette smoke, from lived in [place], moved to [place] after COPD "attack" in the 90s.  Lives with daughter    Questionaires / Pulmonary Flowsheets:   ACT:  No flowsheet data found.  MMRC: No flowsheet data found.  Epworth:  No flowsheet data found.  Tests:   FENO:  No results found for: NITRICOXIDE  PFT: No flowsheet data found.  WALK:  SIX MIN WALK 08/21/2021  Tech  Comments: pt walked at moderate pace, pt felt chest tightness and dizziness L2    Imaging: Personally reviewed and as per EMR discussion this note  Lab Results: Personally reviewed and as per EMR CBC    Component Value Date/Time   WBC 15.1 (H) 03/25/2019 1145   RBC 5.31 03/25/2019 1145   HGB 15.9 03/25/2019 1145   HCT 48.3 03/25/2019 1145   PLT 253 03/25/2019 1145   MCV 91.0 03/25/2019 1145   MCH 29.9 03/25/2019 1145   MCHC 32.9 03/25/2019 1145   RDW 13.2 03/25/2019 1145   LYMPHSABS 2.3 10/07/2017 1850   MONOABS 0.9 10/07/2017 1850   EOSABS 1.0 (H) 10/07/2017 1850   BASOSABS 0.1  10/07/2017 1850    BMET    Component Value Date/Time   NA 138 03/25/2019 1145   K 3.9 03/25/2019 1145   CL 108 03/25/2019 1145   CO2 19 (L) 03/25/2019 1145   GLUCOSE 163 (H) 03/25/2019 1145   BUN 10 03/25/2019 1145   CREATININE 0.94 03/25/2019 1145   CALCIUM 9.2 03/25/2019 1145   GFRNONAA >60 03/25/2019 1145   GFRAA >60 03/25/2019 1145    BNP    Component Value Date/Time   BNP 31.1 03/25/2019 1145    ProBNP No results found for: PROBNP  Specialty Problems       Pulmonary Problems   Chronic obstructive pulmonary disease (HCC)    No Known Allergies  Immunization History  Administered Date(s) Administered   Influenza, High Dose Seasonal PF 08/06/2018   Pneumococcal Conjugate-13 08/06/2018   Tdap 08/06/2018    Past Medical History:  Diagnosis Date   Arthritis    COPD (chronic obstructive pulmonary disease) (HCC)    ED (erectile dysfunction)    Enlarged prostate    Hyperlipidemia     Tobacco History: Social History   Tobacco Use  Smoking Status Former   Packs/day: 1.00   Years: 30.00   Pack years: 30.00   Types: Cigarettes  Smokeless Tobacco Never  Tobacco Comments   Quit 2010   Counseling given: Not Answered Tobacco comments: Quit 2010   Continue to not smoke  Outpatient Encounter Medications as of 11/26/2021  Medication Sig   albuterol (ACCUNEB) 0.63 MG/3ML nebulizer solution Take 3 mLs (0.63 mg total) by nebulization every 6 (six) hours as needed for wheezing.   Albuterol Sulfate (PROAIR HFA IN) Inhale into the lungs.   aspirin 81 MG tablet Take 81 mg by mouth daily.   atorvastatin (LIPITOR) 40 MG tablet Take 1 tablet (40 mg total) by mouth daily.   azelastine (ASTELIN) 0.1 % nasal spray Place 2 sprays into both nostrils daily.   diltiazem (CARDIZEM) 120 MG tablet Take by mouth.   dupilumab (DUPIXENT) 300 MG/2ML prefilled syringe INJECT 1 PREFILLED SYRINGE FOR A 300MG  DOSE SUBCUTANEOUSLY EVERY 14 DAYS DO NOT MAIL --- FOR CLINIC  ADMINISTRATION ONLY   EPINEPHrine 0.3 mg/0.3 mL IJ SOAJ injection INJECT 0.3 ML INTRAMUSCULARLY ONCE AS NEEDED   fluticasone furoate-vilanterol (BREO ELLIPTA) 200-25 MCG/ACT AEPB INHALE 1 PUFF BY MOUTH IN THE MORNING   Glycopyrrolate 15.6 MCG CAPS Place 2 puffs into inhaler and inhale 2 (two) times daily.   ipratropium (ATROVENT) 0.02 % nebulizer solution Use 1 VIAL IN NEBULIZER Q6H (3,9)   levocetirizine (XYZAL) 5 MG tablet Take 1 tablet (5 mg total) by mouth every evening.   Melatonin 3 MG CAPS Take 3 mg by mouth at bedtime as needed.   methimazole (TAPAZOLE) 10 MG tablet Take by mouth.  sertraline (ZOLOFT) 50 MG tablet TAKE ONE-HALF TABLET BY MOUTH IN THE MORNING FOR MENTAL HEALTH   Tiotropium Bromide Monohydrate 2.5 MCG/ACT AERS INHALE 2 PUFFS BY MOUTH ONCE A DAY   UNABLE TO FIND Prostate Defense   [DISCONTINUED] budesonide (PULMICORT) 0.5 MG/2ML nebulizer solution INHALE 1 VIAL TWICE A DAY (MIX WITH SALINE AND IRRIGATE SINUSES)   No facility-administered encounter medications on file as of 11/26/2021.     Review of Systems  Review of Systems  N/a Physical Exam  BP 138/76 (BP Location: Left Arm, Patient Position: Sitting, Cuff Size: Normal)    Pulse 76    Temp 98.7 F (37.1 C) (Oral)    Ht 5\' 6"  (1.676 m)    Wt 193 lb (87.5 kg)    SpO2 97%    BMI 31.15 kg/m   Wt Readings from Last 5 Encounters:  11/26/21 193 lb (87.5 kg)  08/21/21 178 lb 3.2 oz (80.8 kg)  10/06/20 171 lb (77.6 kg)  03/25/19 160 lb (72.6 kg)  11/10/18 167 lb 8 oz (76 kg)    BMI Readings from Last 5 Encounters:  11/26/21 31.15 kg/m  08/21/21 28.76 kg/m  10/06/20 27.60 kg/m  03/25/19 25.82 kg/m  11/10/18 27.04 kg/m     Physical Exam General: Sitting in chair, no acute distress Eyes: EOMI, no icterus Neck: Supple, there is no JVP appreciated Pulmonary: Clear, normal work of breathing, distant Cardiovascular: Regular rate and rhythm, no murmur MSK: No synovitis, no joint effusion Neuro: Normal  gait, no weakness Psych: Normal mood, full affect   Assessment & Plan:   Reports COPD (no PFTs in review), emphysema, asthma:  Baseline dyspnea stable.  Previously had multiple conflicting medications of duplicate medications for his COPD.  Regimen simplified to Breo and Spiriva.  To continue this given relatively well-controlled symptoms.  Nocturnal hypoxemia: With home nocturnal oxygen use.  He reports good adherence to this.  Important he continue this.  Recommend he continue 2 L at night.  Walked around clinic 08/2021, no desaturation with exertion.  Consider 6-minute walk test in the future.   Return in about 6 months (around 05/26/2022).   05/28/2022, MD 11/27/2021

## 2022-11-14 ENCOUNTER — Telehealth: Payer: Self-pay | Admitting: Family Medicine

## 2022-11-14 NOTE — Telephone Encounter (Signed)
Pt's daughter called to see if Dr.Wendling would take him back on as a pt. Stated he needing to be seen as soon as possible. Please advise.

## 2022-11-26 ENCOUNTER — Encounter: Payer: Self-pay | Admitting: Family Medicine

## 2022-11-26 ENCOUNTER — Ambulatory Visit (INDEPENDENT_AMBULATORY_CARE_PROVIDER_SITE_OTHER): Payer: Medicare HMO | Admitting: Family Medicine

## 2022-11-26 VITALS — BP 110/70 | HR 88 | Temp 98.3°F | Ht 66.0 in | Wt 160.0 lb

## 2022-11-26 DIAGNOSIS — J9611 Chronic respiratory failure with hypoxia: Secondary | ICD-10-CM

## 2022-11-26 DIAGNOSIS — I739 Peripheral vascular disease, unspecified: Secondary | ICD-10-CM

## 2022-11-26 DIAGNOSIS — M1991 Primary osteoarthritis, unspecified site: Secondary | ICD-10-CM

## 2022-11-26 DIAGNOSIS — E119 Type 2 diabetes mellitus without complications: Secondary | ICD-10-CM | POA: Diagnosis not present

## 2022-11-26 DIAGNOSIS — J449 Chronic obstructive pulmonary disease, unspecified: Secondary | ICD-10-CM

## 2022-11-26 LAB — BASIC METABOLIC PANEL
BUN: 7 mg/dL (ref 6–23)
CO2: 27 mEq/L (ref 19–32)
Calcium: 9.8 mg/dL (ref 8.4–10.5)
Chloride: 100 mEq/L (ref 96–112)
Creatinine, Ser: 0.98 mg/dL (ref 0.40–1.50)
GFR: 74.26 mL/min (ref >60.00)
Glucose, Bld: 107 mg/dL — ABNORMAL HIGH (ref 70–99)
Potassium: 4.4 mEq/L (ref 3.5–5.1)
Sodium: 136 mEq/L (ref 135–145)

## 2022-11-26 LAB — HEMOGLOBIN A1C: Hgb A1c MFr Bld: 6.1 % (ref 4.6–6.5)

## 2022-11-26 MED ORDER — METFORMIN HCL 500 MG PO TABS: 500.0000 mg | ORAL_TABLET | Freq: Two times a day (BID) | ORAL | 3 refills | Status: AC

## 2022-11-26 MED ORDER — DILTIAZEM HCL ER 60 MG PO CP12: 60.0000 mg | ORAL_CAPSULE | Freq: Every day | ORAL | Status: AC

## 2022-11-26 MED ORDER — OMEPRAZOLE 40 MG PO CPDR: DELAYED_RELEASE_CAPSULE | ORAL | 3 refills | Status: AC

## 2022-11-26 MED ORDER — METHIMAZOLE 5 MG PO TABS: 2.5000 mg | ORAL_TABLET | Freq: Every day | ORAL | Status: AC

## 2022-11-26 MED ORDER — ALBUTEROL SULFATE HFA 108 (90 BASE) MCG/ACT IN AERS: 1.0000 | INHALATION_SPRAY | RESPIRATORY_TRACT | 4 refills | Status: AC | PRN

## 2022-11-26 MED ORDER — ALBUTEROL SULFATE HFA 108 (90 BASE) MCG/ACT IN AERS: 1.0000 | INHALATION_SPRAY | RESPIRATORY_TRACT | Status: DC | PRN

## 2022-11-26 MED ORDER — ATORVASTATIN CALCIUM 40 MG PO TABS: 40.0000 mg | ORAL_TABLET | Freq: Every day | ORAL | 3 refills | Status: AC

## 2022-11-26 NOTE — Progress Notes (Signed)
Subjective:   Chief Complaint  Patient presents with   New Patient (Initial Visit)    RE-establishing    Eugene Daniels is a 77 y.o. male here for follow-up of diabetes.   [name]'s self monitored glucose range is low 100's.  Patient denies hypoglycemic reactions. He checks his glucose levels 1 time(s) per day. Patient does not require insulin.   Medications include: metformin 500 mg bid Diet is fair.  Exercise: walking  Arthritis Pt has a several yr hx of pain in his hands, hips, and feet.  He notes pickle juice helps with his symptoms.  He had physical therapy years past which was helpful for his balance and strength.  After being up, he is interested in doing this again.  Hyperlipidemia Patient presents for dyslipidemia follow up. Currently being treated with Lipitor 40 mg/d and compliance with treatment thus far has been good. He denies myalgias. Diet/exercise as above.  The patient is not known to have coexisting coronary artery disease. No new chest pain or shortness of breath.  Past Medical History:  Diagnosis Date   Arthritis    COPD (chronic obstructive pulmonary disease) ([facility])    Diabetes mellitus type 2 in nonobese [facility])    ED (erectile dysfunction)    Enlarged prostate    Hyperlipidemia      Related testing: Retinal exam: Done Pneumovax: done  Objective:  BP 110/70 (BP Location: Left Arm, Patient Position: Sitting, Cuff Size: Normal)   Pulse 88   Temp 98.3 F (36.8 C) (Oral)   Ht 5' 6"$  (1.676 m)   Wt 160 lb (72.6 kg)   SpO2 97%   BMI 25.82 kg/m  General:  Well developed, well nourished, in no apparent distress Skin:  Warm, no pallor or diaphoresis on exposed surfaces Lungs:  CTAB, no access msc use Cardio:  RRR, no bruits, no LE edema Psych: Age appropriate judgment and insight  Assessment:   Diabetes mellitus type 2 in nonobese ([facility]) - Plan: Basic metabolic panel, Microalbumin / creatinine urine ratio, Hemoglobin A1c  Chronic obstructive  pulmonary disease, unspecified COPD type (HCC) - Plan: albuterol (PROAIR HFA) 108 (90 Base) MCG/ACT inhaler  PAD (peripheral artery disease) (HCC) - Plan: atorvastatin (LIPITOR) 40 MG tablet  Primary osteoarthritis, unspecified site - Plan: Ambulatory referral to Physical Therapy  Chronic respiratory failure with hypoxia (HCC)   Plan:   Chronic, stable. Cont metformin 500 mg bid. Counseled on diet and exercise.  Check labs today. Chronic, stable.  Continue oxygen 4 L nasal cannula.  Continue triple therapy. Chronic, stable.  Continue Lipitor 40 mg daily. Foods/supplements that may reduce pain given in paperwork.  Tylenol, ice, physical therapy. Reports having had his pneumonia vaccine.  [facility] and [facility] [place] with the date he received this. F/u in 6 mo. The patient voiced understanding and agreement to the plan.  [place], DO 11/26/22 2:50 PM

## 2022-11-26 NOTE — Patient Instructions (Addendum)
Foods that may reduce pain: 1) Ginger 2) Blueberries 3) Salmon 4) Pumpkin seeds 5) dark chocolate 6) turmeric 7) tart cherries 8) virgin olive oil 9) chilli peppers 10) mint 11) krill oil  OK to take Tylenol 1000 mg (2 extra strength tabs) or 975 mg (3 regular strength tabs) every 6 hours as needed.  Ice/cold pack over area for 10-15 min twice daily.  If you do not hear anything about your referral in the next 1-2 weeks, call our office and ask for an update.  Give [place] 2-3 business days to get the results of your labs back.   Keep the diet clean and stay active.  Aim to do some physical exertion for 150 minutes per week. This is typically divided into 5 days per week, 30 minutes per day. The activity should be enough to get your heart rate up. Anything is better than nothing if you have time constraints.  Let us know if you need anything.

## 2022-11-27 DIAGNOSIS — J441 Chronic obstructive pulmonary disease with (acute) exacerbation: Secondary | ICD-10-CM | POA: Diagnosis not present

## 2022-11-27 DIAGNOSIS — M79671 Pain in right foot: Secondary | ICD-10-CM | POA: Diagnosis not present

## 2022-11-27 DIAGNOSIS — R062 Wheezing: Secondary | ICD-10-CM | POA: Diagnosis not present

## 2022-11-27 DIAGNOSIS — R7989 Other specified abnormal findings of blood chemistry: Secondary | ICD-10-CM | POA: Diagnosis not present

## 2022-11-27 DIAGNOSIS — I21A1 Myocardial infarction type 2: Secondary | ICD-10-CM | POA: Diagnosis not present

## 2022-11-27 DIAGNOSIS — I214 Non-ST elevation (NSTEMI) myocardial infarction: Secondary | ICD-10-CM | POA: Diagnosis not present

## 2022-11-27 DIAGNOSIS — J9601 Acute respiratory failure with hypoxia: Secondary | ICD-10-CM | POA: Diagnosis not present

## 2022-11-27 DIAGNOSIS — I491 Atrial premature depolarization: Secondary | ICD-10-CM | POA: Diagnosis not present

## 2022-11-27 DIAGNOSIS — E039 Hypothyroidism, unspecified: Secondary | ICD-10-CM | POA: Diagnosis not present

## 2022-11-27 DIAGNOSIS — J9622 Acute and chronic respiratory failure with hypercapnia: Secondary | ICD-10-CM | POA: Diagnosis not present

## 2022-11-27 DIAGNOSIS — E119 Type 2 diabetes mellitus without complications: Secondary | ICD-10-CM | POA: Diagnosis not present

## 2022-11-27 DIAGNOSIS — F32A Depression, unspecified: Secondary | ICD-10-CM | POA: Diagnosis not present

## 2022-11-27 DIAGNOSIS — R0602 Shortness of breath: Secondary | ICD-10-CM | POA: Diagnosis not present

## 2022-11-27 DIAGNOSIS — R778 Other specified abnormalities of plasma proteins: Secondary | ICD-10-CM | POA: Diagnosis not present

## 2022-11-27 DIAGNOSIS — J9621 Acute and chronic respiratory failure with hypoxia: Secondary | ICD-10-CM | POA: Diagnosis not present

## 2022-11-27 DIAGNOSIS — F419 Anxiety disorder, unspecified: Secondary | ICD-10-CM | POA: Diagnosis not present

## 2022-11-27 DIAGNOSIS — E785 Hyperlipidemia, unspecified: Secondary | ICD-10-CM | POA: Diagnosis not present

## 2022-11-27 DIAGNOSIS — E0500 Thyrotoxicosis with diffuse goiter without thyrotoxic crisis or storm: Secondary | ICD-10-CM | POA: Diagnosis not present

## 2022-11-27 DIAGNOSIS — Z20822 Contact with and (suspected) exposure to covid-19: Secondary | ICD-10-CM | POA: Diagnosis not present

## 2022-11-27 DIAGNOSIS — Z87891 Personal history of nicotine dependence: Secondary | ICD-10-CM | POA: Diagnosis not present

## 2022-11-27 DIAGNOSIS — I10 Essential (primary) hypertension: Secondary | ICD-10-CM | POA: Diagnosis not present

## 2022-11-27 LAB — MICROALBUMIN / CREATININE URINE RATIO
Creatinine,U: 213.1 mg/dL
Microalb Creat Ratio: 15.3 mg/g (ref 0.0–30.0)
Microalb, Ur: 32.6 mg/dL — ABNORMAL HIGH (ref 0.0–1.9)

## 2022-12-11 ENCOUNTER — Ambulatory Visit: Payer: Medicaid Other | Admitting: Pulmonary Disease

## 2022-12-16 ENCOUNTER — Other Ambulatory Visit: Payer: Self-pay

## 2022-12-16 ENCOUNTER — Ambulatory Visit: Payer: Medicare HMO | Attending: Family Medicine

## 2022-12-16 DIAGNOSIS — M5441 Lumbago with sciatica, right side: Secondary | ICD-10-CM | POA: Diagnosis not present

## 2022-12-16 DIAGNOSIS — J439 Emphysema, unspecified: Secondary | ICD-10-CM | POA: Insufficient documentation

## 2022-12-16 DIAGNOSIS — M1991 Primary osteoarthritis, unspecified site: Secondary | ICD-10-CM | POA: Diagnosis not present

## 2022-12-16 DIAGNOSIS — M25551 Pain in right hip: Secondary | ICD-10-CM | POA: Diagnosis not present

## 2022-12-16 DIAGNOSIS — Z7409 Other reduced mobility: Secondary | ICD-10-CM | POA: Diagnosis not present

## 2022-12-16 DIAGNOSIS — M25552 Pain in left hip: Secondary | ICD-10-CM | POA: Insufficient documentation

## 2022-12-16 DIAGNOSIS — G8929 Other chronic pain: Secondary | ICD-10-CM | POA: Insufficient documentation

## 2022-12-16 DIAGNOSIS — M5442 Lumbago with sciatica, left side: Secondary | ICD-10-CM | POA: Diagnosis not present

## 2022-12-16 NOTE — Therapy (Signed)
OUTPATIENT PHYSICAL THERAPY LOWER EXTREMITY EVALUATION   Patient Name: Eugene Daniels MRN: [id number] DOB:07-Feb-1946, 77 y.o., male Today's Date: 12/16/2022  END OF SESSION:  PT End of Session - 12/16/22 1533     Visit Number 1    Date for PT Re-Evaluation 03/10/23    Progress Note Due on Visit 10    PT Start Time 1534    PT Stop Time 1615    PT Time Calculation (min) 41 min             Past Medical History:  Diagnosis Date   Arthritis    COPD (chronic obstructive pulmonary disease) ([place])    Diabetes mellitus type 2 in nonobese [facility])    ED (erectile dysfunction)    Enlarged prostate    Hyperlipidemia    History reviewed. No pertinent surgical history. Patient Active Problem List   Diagnosis Date Noted   Diabetes mellitus type 2 in nonobese ([place]) 11/26/2022   Hyperlipidemia 11/10/2018   Seasonal allergies 11/10/2018   Benign prostatic hyperplasia with urinary frequency 07/07/2018   PAD (peripheral artery disease) ([place]) 07/07/2018   RLS (restless legs syndrome) 07/07/2018   Onychomycosis 07/07/2018   Chronic obstructive pulmonary disease ([place]) 07/07/2018    PCP: Riki Sheer, MD  REFERRING PROVIDER: Riki Sheer, MD  REFERRING DIAG: imbalance   THERAPY DIAG:  Impaired functional mobility, balance, and endurance - Plan: PT plan of care cert/re-cert  Pain of both hip joints - Plan: PT plan of care cert/re-cert  Chronic bilateral low back pain with bilateral sciatica - Plan: PT plan of care cert/re-cert  Pulmonary emphysema, unspecified emphysema type ([place]) - Plan: PT plan of care cert/re-cert  Rationale for Evaluation and Treatment: Rehabilitation  ONSET DATE: Nov 2024  SUBJECTIVE:   SUBJECTIVE STATEMENT: I've been hospitalized over the last 2 Thanksgivings, go outside and breath cold air then got sick. Have gotten weak and difficulty with stiffness multiple joints , legs , hands. Need to be able to move better  PERTINENT HISTORY: COPD,  diffuse arthritis, referred by PCP to assist with balance and arthritic assessment PAIN:  Are you having pain? Yes: NPRS scale: 3/10 Pain location: B hips, lower back, sides of legs, hands  Pain description: everything hurts when I am still for a long time.  My hands cramp up and lock  Aggravating factors: prolonged sitting and lying  Relieving factors: rest,walking  PRECAUTIONS: Other: supplemental O2  WEIGHT BEARING RESTRICTIONS: No  FALLS:  Has patient fallen in last 6 months? No  LIVING ENVIRONMENT: Lives with: lives with their family and lives with their daughter Lives in: House/apartment Stairs: Yes: External: 3 steps; bilateral but cannot reach both Has following equipment at home:  supplemental O2 tank  OCCUPATION: retired  PLOF: Independent and Independent with basic ADLs  PATIENT GOALS: get more mobile, less stiff  NEXT MD VISIT: none scheduled currently  OBJECTIVE:   DIAGNOSTIC FINDINGS: na  PATIENT SURVEYS:  ABC scale 7.06% confidence LEFS 47/80  COGNITION: Overall cognitive status: Within functional limits for tasks assessed     SENSATION: WFL  EDEMA: no edema noted   MUSCLE LENGTH: Hamstrings: Right -10  deg; Left -10 deg   POSTURE: lean male, loss of lumbar lordosis noted.  B genuvarum  PALPATION: na  LOWER EXTREMITY ROM:all wfl   LOWER EXTREMITY MMT:  MMT Right eval Left eval  Hip flexion 5 5  Hip extension 3+ 4  Hip abduction 5 5  Hip adduction    Hip internal rotation  Hip external rotation    Knee flexion 5 5  Knee extension 5 5  Ankle dorsiflexion 5 5  Ankle plantarflexion    Ankle inversion    Ankle eversion     (Blank rows = not tested) Functional testing:  lock bridge R 3+/5, L 4/5 Heel walking wnl Toe walking unable, poor control FUNCTIONAL TESTS:  5 times sit to stand: 10.88 Berg Balance Scale: 41/55  GAIT: Distance walked: 240' Assistive device utilized:  portable O2 tank Level of assistance:  SBA Comments: gait speed 1.50mec   TODAY'S TREATMENT:                                                                                                                              DATE: 12/16/22: evaluation of balance, functional strength Instructed in toe raises and bridging for home, see ex list below    PATIENT EDUCATION:  Education details: POC goals Person educated: Patient Education method: Explanation, Demonstration, and Handouts Education comprehension: verbalized understanding  HOME EXERCISE PROGRAM: Access Code: [email or web address] URL: [email or web address] Date: 12/16/2022 Prepared by: AHayden Pedro Exercises - Supine Bridge  - 1 x daily - 7 x weekly - 3 sets - 10 reps - Heel Toe Raises with Unilateral Counter Support  - 1 x daily - 7 x weekly - 3 sets - 10 reps  ASSESSMENT:  CLINICAL IMPRESSION: Patient is a 77y.o. male who was seen today for physical therapy evaluation and treatment for stiffness, altered gait and activity tolerance with multiple joint pain, and higher level balance deficits.  He does have COPD, and today was concerned that his portable o2 tank was going to run out during the session.  Presents with some strength deficits B glut maximus and B plantarflexors.  Wants to be able to walk longer and go out to store with his daughter without being so fatigued.  Would benefit from skilled physical therapy to establish strengthening and endurance program to follow through at home eventually.  Does have a treadmill and bicycle at home   OBJECTIVE IMPAIRMENTS: cardiopulmonary status limiting activity, decreased activity tolerance, decreased balance, decreased strength, and pain.   ACTIVITY LIMITATIONS: carrying, lifting, and stairs  PARTICIPATION LIMITATIONS: laundry, shopping, and community activity  PERSONAL FACTORS: Time since onset of injury/illness/exacerbation, Transportation, and 1-2 comorbidities: COPD, arthritis, DM  are also affecting patient's  functional outcome.   REHAB POTENTIAL: Good  CLINICAL DECISION MAKING: Stable/uncomplicated  EVALUATION COMPLEXITY: Low   GOALS: Goals reviewed with patient? Yes  SHORT TERM GOALS: Target date: 12/30/22:   I initial HEP Baseline:initiated today Goal status: INITIAL  LONG TERM GOALS: Target date: 03/10/23  Improve score on Berg form 41/56 to 46/56 for improved community ambulation Baseline: 41/56 Goal status: INITIAL  2.  Improve strength for B hip extension and B plantarflexion to 5/5 Baseline: 3+ and 4/5 Goal status: INITIAL  3.  Improve balance, able to tandem stand 30 sec for reduced fall risk Baseline: 4 sec  Goal status: INITIAL  PLAN:  PT FREQUENCY: 1-2x/week  PT DURATION: 12 weeks  PLANNED INTERVENTIONS: Therapeutic exercises, Therapeutic activity, Neuromuscular re-education, Balance training, Gait training, Patient/Family education, Self Care, Joint mobilization, and Prosthetic training  PLAN FOR NEXT SESSION: Progress with endurance activity on TM or bike(as patient has at home) and progress B LE posterior chain strength   Toshiro Hanken L Travius Crochet, PT 12/16/2022, 5:16 PM

## 2022-12-18 ENCOUNTER — Encounter: Payer: Self-pay | Admitting: Pulmonary Disease

## 2022-12-18 ENCOUNTER — Telehealth: Payer: Self-pay | Admitting: Pulmonary Disease

## 2022-12-18 ENCOUNTER — Ambulatory Visit: Payer: No Typology Code available for payment source | Admitting: Pulmonary Disease

## 2022-12-18 VITALS — BP 124/66 | HR 61 | Ht 66.0 in | Wt 167.0 lb

## 2022-12-18 DIAGNOSIS — J449 Chronic obstructive pulmonary disease, unspecified: Secondary | ICD-10-CM

## 2022-12-18 DIAGNOSIS — R6889 Other general symptoms and signs: Secondary | ICD-10-CM | POA: Diagnosis not present

## 2022-12-18 NOTE — Patient Instructions (Signed)
Nice to see you  No changes to medications  Will send new order to [facility] for portable oxygen concentrator and new oxygen orders.  Return to clinic in 3 months or sooner as needed with Dr. Silas Flood

## 2022-12-18 NOTE — Progress Notes (Signed)
$'@Patient'y$  ID: Eugene Daniels, male    DOB: Jan 21, 1946, 77 y.o.   MRN: [id number]  Chief Complaint  Patient presents with   Follow-up    Pt is here for follow up with COPD. Pt states he was in hospital last thanksgiving given the weater changing. Pt states that the Stiolto is helping with the COPD.     Referring provider: Shelda Pal*  HPI:   77 y.o. whom we are seeing in follow-up for evaluation of COPD and emphysema.  Hospitalized twice in the last few months, 09/01/2022 and 12/02/2022.  H&P and discharge summary from each admission reviewed.  Returns to clinic after prolonged absence, follow-up, lost to follow-up.  He was hospitalized at the [place] [facility] 08/2022 with COVID.  COPD exacerbation requiring oxygen.  He was discharged with new 2 L daytime oxygen requirement.  He was admitted again 11/2022 with COPD exacerbation.  Discharged on 3 L nasal cannula.  Previously through with that.  Recently restarted 5 and got oxygen to the [place].  Unfortunately, the [place] cannot have access to a portable oxygen concentrator he says.  [facility] and [facility] about [facility].  He is very concerned.  He would like to have a portable oxygen concentrator to preclude his need for tanks on a regular basis.  He is very concerned that they have not given him oxygen which I agree with.  He states he has spoken with [facility] and they can provide this theoretically.  I did discuss with him this may not be an option.  Will see if we can qualify today.  There are also oxygen regulations on him fully understand that may not allow him to have this.  HPI initial visit: Patient referred for ongoing treatment of COPD, asthma.  Previously followed elsewhere in pulmonary office.  He reports COPD in the past based on the breathing test.  I cannot review the results of this test.  Overall, he seems to be doing relatively okay.  Sound like he had recurrent exacerbations many years ago while he  was still smoking.  Serious 1 in the 90s that led to him moving to [place] with his daughter from [place].  Originally from [place]. There are multiple duplicate medications list list.  A bit confused about what he is using.  Prescribed Breo and Spiriva as well as budesonide nebs.  He is not really doing the budesonide nebs currently.  Said that was placed on his chart with recent at 1 point time due to going to the ED or hospitalization.  He cannot recall.  He has baseline dyspnea.  No better or worse.  No time of day to things otherwise.  No position makes it better or worse.  No seasonal environmental factors identified to make things better or worse.  He does think the inhaler therapy helps some.  When he misses doses occasionally he notes his breathing is worse.  He is on Dupixent for nasal polyps, nasal issues.  Does not notice any difference in his breathing since starting the Dupixent.  He has nocturnal oxygen.  At 4L, then 2 L, currently 2 L.  He was told to decrease by his pulmonary doctor in the past.  He reports good adherence to this.  He is interested in a portable oxygen concentrator.  He does not need oxygen during the day it is documented.  He occasionally puts it on during the day to help with his  dyspnea.  No documented oxygen saturation with exertion or at rest.  Plan to walk around the office today to further evaluate.  PMH: Tobacco abuse in remission, reported COPD, seasonal allergies, nasal polyps Surgical history:History reviewed. No pertinent surgical history. Family history:History reviewed. No pertinent family history. Social history: Former smoker, 40-pack-year, quit around 2009 for GERD with multiple prior durations of absence of cigarette smoke, from lived in [place], moved to [place] after COPD "attack" in the 90s.  Lives with daughter    Questionaires / Pulmonary Flowsheets:   ACT:      No data to display          MMRC:     No data to display           Epworth:      No data to display          Tests:   FENO:  No results found for: "NITRICOXIDE"  PFT:     No data to display          WALK:     08/21/2021   11:57 AM  SIX MIN WALK  Tech Comments: pt walked at moderate pace, pt felt chest tightness and dizziness L2    Imaging: Personally reviewed and as per EMR discussion this note  Lab Results: Personally reviewed and as per EMR CBC    Component Value Date/Time   WBC 15.1 (H) 03/25/2019 1145   RBC 5.31 03/25/2019 1145   HGB 15.9 03/25/2019 1145   HCT 48.3 03/25/2019 1145   PLT 253 03/25/2019 1145   MCV 91.0 03/25/2019 1145   MCH 29.9 03/25/2019 1145   MCHC 32.9 03/25/2019 1145   RDW 13.2 03/25/2019 1145   LYMPHSABS 2.3 10/07/2017 1850   MONOABS 0.9 10/07/2017 1850   EOSABS 1.0 (H) 10/07/2017 1850   BASOSABS 0.1 10/07/2017 1850    BMET    Component Value Date/Time   NA 136 11/26/2022 1401   K 4.4 11/26/2022 1401   CL 100 11/26/2022 1401   CO2 27 11/26/2022 1401   GLUCOSE 107 (H) 11/26/2022 1401   BUN 7 11/26/2022 1401   CREATININE 0.98 11/26/2022 1401   CALCIUM 9.8 11/26/2022 1401   GFRNONAA >60 03/25/2019 1145   GFRAA >60 03/25/2019 1145    BNP    Component Value Date/Time   BNP 31.1 03/25/2019 1145    ProBNP No results found for: "PROBNP"  Specialty Problems       Pulmonary Problems   Chronic obstructive pulmonary disease ([place])    No Known Allergies  Immunization History  Administered Date(s) Administered   Fluad Quad(high Dose 65+) 07/10/2020, 08/22/2021   Hep A / Hep B 02/07/2013, 11/02/2013   Influenza, High Dose Seasonal PF 08/06/2018, 08/16/2019, 08/11/2022   Influenza,inj,Quad PF,6+ Mos 12/15/2022   Influenza-Unspecified 07/13/2012, 08/13/2014, 12/12/2015, 08/18/2016   Moderna Sars-Covid-2 Vaccination 11/26/2019, 12/24/2019, 01/08/2021   Pneumococcal Conjugate-13 08/14/2015, 08/06/2018   Pneumococcal Polysaccharide-23 05/22/2016, 07/27/2019    RSV,unspecified 10/14/2022   Rsv, Bivalent, Protein Subunit Rsvpref,pf [facility]) 10/14/2022   Tdap 11/02/2013, 08/06/2018   Zoster Recombinat (Shingrix) 06/13/2020, 08/16/2020   Zoster, Unspecified 06/13/2020, 08/16/2020    Past Medical History:  Diagnosis Date   Arthritis    COPD (chronic obstructive pulmonary disease) ([place])    Diabetes mellitus type 2 in nonobese [facility])    ED (erectile dysfunction)    Enlarged prostate    Hyperlipidemia     Tobacco History: Social History   Tobacco Use  Smoking Status Former  Packs/day: 1.00   Years: 30.00   Total pack years: 30.00   Types: Cigarettes  Smokeless Tobacco Never  Tobacco Comments   Quit 2010   Counseling given: Not Answered Tobacco comments: Quit 2010   Continue to not smoke  Outpatient Encounter Medications as of 12/18/2022  Medication Sig   albuterol (PROAIR HFA) 108 (90 Base) MCG/ACT inhaler Inhale 1-2 puffs into the lungs every 4 (four) hours as needed.   atorvastatin (LIPITOR) 40 MG tablet Take 1 tablet (40 mg total) by mouth daily.   diltiazem (CARDIZEM SR) 60 MG 12 hr capsule Take 1 capsule (60 mg total) by mouth daily.   dupilumab (DUPIXENT) 300 MG/2ML prefilled syringe INJECT 1 PREFILLED SYRINGE FOR A '300MG'$  DOSE SUBCUTANEOUSLY EVERY 14 DAYS DO NOT MAIL --- FOR CLINIC ADMINISTRATION ONLY   EPINEPHrine 0.3 mg/0.3 mL IJ SOAJ injection INJECT 0.3 ML INTRAMUSCULARLY ONCE AS NEEDED   fluticasone furoate-vilanterol (BREO ELLIPTA) 200-25 MCG/ACT AEPB INHALE 1 PUFF BY MOUTH IN THE MORNING   levocetirizine (XYZAL) 5 MG tablet Take 1 tablet (5 mg total) by mouth every evening.   metFORMIN (GLUCOPHAGE) 500 MG tablet Take 1 tablet (500 mg total) by mouth 2 (two) times daily with a meal.   methimazole (TAPAZOLE) 5 MG tablet Take 0.5 tablets (2.5 mg total) by mouth daily.   omeprazole (PRILOSEC) 40 MG capsule Take 1 by mouth 30 minutes prior to a meal.   sertraline (ZOLOFT) 50 MG tablet TAKE ONE-HALF TABLET BY MOUTH IN THE  MORNING FOR MENTAL HEALTH   Tiotropium Bromide Monohydrate 2.5 MCG/ACT AERS INHALE 2 PUFFS BY MOUTH ONCE A DAY   No facility-administered encounter medications on file as of 12/18/2022.     Review of Systems  Review of Systems  N/a Physical Exam  BP 124/66 (BP Location: Left Arm, Patient Position: Sitting, Cuff Size: Normal)   Pulse 61   Ht '5\' 6"'$  (1.676 m)   Wt 167 lb (75.8 kg)   SpO2 98% Comment: 3L via [place]  BMI 26.95 kg/m   Wt Readings from Last 5 Encounters:  12/18/22 167 lb (75.8 kg)  11/26/22 160 lb (72.6 kg)  11/26/21 193 lb (87.5 kg)  08/21/21 178 lb 3.2 oz (80.8 kg)  10/06/20 171 lb (77.6 kg)    BMI Readings from Last 5 Encounters:  12/18/22 26.95 kg/m  11/26/22 25.82 kg/m  11/26/21 31.15 kg/m  08/21/21 28.76 kg/m  10/06/20 27.60 kg/m     Physical Exam General: Sitting in chair, no acute distress Eyes: EOMI, no icterus Neck: Supple, there is no JVP appreciated Pulmonary: Clear, normal work of breathing, distant Cardiovascular: Regular rate and rhythm, no murmur MSK: No synovitis, no joint effusion Neuro: Normal gait, no weakness Psych: Normal mood, full affect   Assessment & Plan:   Reports COPD (no PFTs in review), emphysema, asthma: With multiple recent exacerbations.  On maximal medical therapy with Breo and Spiriva.  Can consider nebulized therapy in the future if exacerbations continue.  Continue Spiriva and Breo.  Albuterol as needed.  Chronic hypoxemic respiratory failure: Previously on nocturnal oxygen in 2023.  With multiple exacerbations recently now requiring up to 3 L he reports.  Will ambulate around the office today to see if he qualifies for a POC.  If so we will place new orders.  Instructed to keep oxygen saturation is 88% or higher.   Return in about 3 months (around 03/20/2023).   Lanier Clam, MD 12/18/2022   I spent 40 minutes in care of  patient including face-to-face visit, review of records, coordination of care.

## 2022-12-18 NOTE — Telephone Encounter (Signed)
New order placed. Nothing further needed. 

## 2022-12-23 ENCOUNTER — Other Ambulatory Visit: Payer: Self-pay

## 2022-12-23 ENCOUNTER — Ambulatory Visit: Payer: Medicare HMO

## 2022-12-23 DIAGNOSIS — Z7409 Other reduced mobility: Secondary | ICD-10-CM | POA: Diagnosis not present

## 2022-12-23 DIAGNOSIS — G8929 Other chronic pain: Secondary | ICD-10-CM | POA: Diagnosis not present

## 2022-12-23 DIAGNOSIS — M1991 Primary osteoarthritis, unspecified site: Secondary | ICD-10-CM | POA: Diagnosis not present

## 2022-12-23 DIAGNOSIS — J439 Emphysema, unspecified: Secondary | ICD-10-CM

## 2022-12-23 DIAGNOSIS — M25551 Pain in right hip: Secondary | ICD-10-CM

## 2022-12-23 DIAGNOSIS — M5442 Lumbago with sciatica, left side: Secondary | ICD-10-CM | POA: Diagnosis not present

## 2022-12-23 DIAGNOSIS — M25552 Pain in left hip: Secondary | ICD-10-CM | POA: Diagnosis not present

## 2022-12-23 DIAGNOSIS — M5441 Lumbago with sciatica, right side: Secondary | ICD-10-CM | POA: Diagnosis not present

## 2022-12-23 NOTE — Therapy (Signed)
OUTPATIENT PHYSICAL THERAPY LOWER EXTREMITY EVALUATION   Patient Name: Eugene Daniels MRN: [id number] DOB:March 26, 1946, 77 y.o., male Today's Date: 12/23/2022  END OF SESSION:  PT End of Session - 12/23/22 1451     Visit Number 2    Date for PT Re-Evaluation 03/10/23    Progress Note Due on Visit 10    PT Start Time 1448    PT Stop Time 1530    PT Time Calculation (min) 42 min    Activity Tolerance Patient tolerated treatment well    Behavior During Therapy WFL for tasks assessed/performed             Past Medical History:  Diagnosis Date   Arthritis    COPD (chronic obstructive pulmonary disease) ([place])    Diabetes mellitus type 2 in nonobese [facility])    ED (erectile dysfunction)    Enlarged prostate    Hyperlipidemia    History reviewed. No pertinent surgical history. Patient Active Problem List   Diagnosis Date Noted   Diabetes mellitus type 2 in nonobese ([place]) 11/26/2022   Hyperlipidemia 11/10/2018   Seasonal allergies 11/10/2018   Benign prostatic hyperplasia with urinary frequency 07/07/2018   PAD (peripheral artery disease) ([place]) 07/07/2018   RLS (restless legs syndrome) 07/07/2018   Onychomycosis 07/07/2018   Chronic obstructive pulmonary disease ([place]) 07/07/2018    PCP: Riki Sheer, MD  REFERRING PROVIDER: Riki Sheer, MD  REFERRING DIAG: imbalance   THERAPY DIAG:  Impaired functional mobility, balance, and endurance  Pain of both hip joints  Chronic bilateral low back pain with bilateral sciatica  Pulmonary emphysema, unspecified emphysema type [facility])  Rationale for Evaluation and Treatment: Rehabilitation  ONSET DATE: Nov 2024  SUBJECTIVE:   SUBJECTIVE STATEMENT: Not as stiff today, walked on treadmill at the house 10 min slowly. PERTINENT HISTORY: COPD, diffuse arthritis, referred by PCP to assist with balance and arthritic assessment PAIN:  Are you having pain? Yes: NPRS scale: 3/10 Pain location: B hips, lower back, sides  of legs, hands  Pain description: everything hurts when I am still for a long time.  My hands cramp up and lock  Aggravating factors: prolonged sitting and lying  Relieving factors: rest,walking  PRECAUTIONS: Other: supplemental O2  WEIGHT BEARING RESTRICTIONS: No  FALLS:  Has patient fallen in last 6 months? No  LIVING ENVIRONMENT: Lives with: lives with their family and lives with their daughter Lives in: House/apartment Stairs: Yes: External: 3 steps; bilateral but cannot reach both Has following equipment at home:  supplemental O2 tank  OCCUPATION: retired  PLOF: Independent and Independent with basic ADLs  PATIENT GOALS: get more mobile, less stiff  NEXT MD VISIT: none scheduled currently  OBJECTIVE:   DIAGNOSTIC FINDINGS: na  PATIENT SURVEYS:  ABC scale 7.06% confidence LEFS 47/80  COGNITION: Overall cognitive status: Within functional limits for tasks assessed     SENSATION: WFL  EDEMA: no edema noted   MUSCLE LENGTH: Hamstrings: Right -10  deg; Left -10 deg   POSTURE: lean male, loss of lumbar lordosis noted.  B genuvarum  PALPATION: na  LOWER EXTREMITY ROM:all wfl   LOWER EXTREMITY MMT:  MMT Right eval Left eval  Hip flexion 5 5  Hip extension 3+ 4  Hip abduction 5 5  Hip adduction    Hip internal rotation    Hip external rotation    Knee flexion 5 5  Knee extension 5 5  Ankle dorsiflexion 5 5  Ankle plantarflexion    Ankle inversion    Ankle  eversion     (Blank rows = not tested) Functional testing:  lock bridge R 3+/5, L 4/5 Heel walking wnl Toe walking unable, poor control FUNCTIONAL TESTS:  5 times sit to stand: 10.88 Berg Balance Scale: 41/55  GAIT: Distance walked: 240' Assistive device utilized:  portable O2 tank Level of assistance: SBA Comments: gait speed 1.60mec   TODAY'S TREATMENT:                                                                                                                                DATE:  Nustep level 4 6 min Standing toe raises 15 reps Standing  Seated B hamstring curls, 15#, 3 sets 10 Seated long arc quads B 10#, 3 sets 10 Sit to stand no hands, isometric resistance with t band for hip abd for proximal stability 10 x  Standing 3 way hip 10 reps each leg 2#  Seated rows, hands neutral 15#, 10 x 2 Seated rows, palms horizontal  15#, 10 x 2  12/16/22: evaluation of balance, functional strength Instructed in toe raises and bridging for home, see ex list below    PATIENT EDUCATION:  Education details: POC goals Person educated: Patient Education method: Explanation, Demonstration, and Handouts Education comprehension: verbalized understanding  HOME EXERCISE PROGRAM: Access Code: [email or web address] URL: [email or web address] Date: 12/16/2022 Prepared by: AHayden Pedro Exercises - Supine Bridge  - 1 x daily - 7 x weekly - 3 sets - 10 reps - Heel Toe Raises with Unilateral Counter Support  - 1 x daily - 7 x weekly - 3 sets - 10 reps  ASSESSMENT:  CLINICAL IMPRESSION: Patient is a 77y.o. male who was seen today for physical therapy  treatment for stiffness, altered gait and activity tolerance with multiple joint pain, and higher level balance deficits. Progressed him today with varied isotonic as well as closed chain strengthening for his LE's, finished with postural strengthening. He monitored his O2 saturation at 95% each time with his supplemental O2.   OBJECTIVE IMPAIRMENTS: cardiopulmonary status limiting activity, decreased activity tolerance, decreased balance, decreased strength, and pain.   ACTIVITY LIMITATIONS: carrying, lifting, and stairs  PARTICIPATION LIMITATIONS: laundry, shopping, and community activity  PERSONAL FACTORS: Time since onset of injury/illness/exacerbation, Transportation, and 1-2 comorbidities: COPD, arthritis, DM  are also affecting patient's functional outcome.   REHAB POTENTIAL: Good  CLINICAL DECISION MAKING:  Stable/uncomplicated  EVALUATION COMPLEXITY: Low   GOALS: Goals reviewed with patient? Yes  SHORT TERM GOALS: Target date: 12/30/22:   I initial HEP Baseline:initiated today Goal status: INITIAL  LONG TERM GOALS: Target date: 03/10/23  Improve score on Berg form 41/56 to 46/56 for improved community ambulation Baseline: 41/56 Goal status: INITIAL  2.  Improve strength for B hip extension and B plantarflexion to 5/5 Baseline: 3+ and 4/5 Goal status: INITIAL  3.  Improve balance, able to tandem stand 30 sec for reduced fall risk Baseline: 4 sec Goal status: INITIAL  PLAN:  PT FREQUENCY: 1-2x/week  PT DURATION: 12 weeks  PLANNED INTERVENTIONS: Therapeutic exercises, Therapeutic activity, Neuromuscular re-education, Balance training, Gait training, Patient/Family education, Self Care, Joint mobilization, and Prosthetic training  PLAN FOR NEXT SESSION: progress B LE posterior chain strength   Marleigh Kaylor L Haidan Nhan, PT 12/23/2022, 3:29 PM

## 2022-12-25 ENCOUNTER — Telehealth: Payer: Self-pay | Admitting: Pulmonary Disease

## 2022-12-25 ENCOUNTER — Other Ambulatory Visit: Payer: Self-pay

## 2022-12-25 ENCOUNTER — Ambulatory Visit: Payer: Medicare HMO

## 2022-12-25 DIAGNOSIS — Z7409 Other reduced mobility: Secondary | ICD-10-CM | POA: Diagnosis not present

## 2022-12-25 DIAGNOSIS — M5442 Lumbago with sciatica, left side: Secondary | ICD-10-CM | POA: Diagnosis not present

## 2022-12-25 DIAGNOSIS — M1991 Primary osteoarthritis, unspecified site: Secondary | ICD-10-CM | POA: Diagnosis not present

## 2022-12-25 DIAGNOSIS — M5441 Lumbago with sciatica, right side: Secondary | ICD-10-CM | POA: Diagnosis not present

## 2022-12-25 DIAGNOSIS — J439 Emphysema, unspecified: Secondary | ICD-10-CM | POA: Diagnosis not present

## 2022-12-25 DIAGNOSIS — M25552 Pain in left hip: Secondary | ICD-10-CM | POA: Diagnosis not present

## 2022-12-25 DIAGNOSIS — G8929 Other chronic pain: Secondary | ICD-10-CM

## 2022-12-25 DIAGNOSIS — M25551 Pain in right hip: Secondary | ICD-10-CM | POA: Diagnosis not present

## 2022-12-25 NOTE — Telephone Encounter (Signed)
Called and spoke to pt about oxygen order. I did review pts chart and did see an order placed for oxygen to [facility], but the orderwas closed. I did inform the pt that I would send this over to the patient care coordinators to figure this out. [facility] please advise

## 2022-12-25 NOTE — Therapy (Signed)
OUTPATIENT PHYSICAL THERAPY LOWER EXTREMITY EVALUATION   Patient Name: Eugene Daniels MRN: [id number] DOB:[birth year], 77 y.o., male Today's Date: 12/25/2022  END OF SESSION:  PT End of Session - 12/25/22 0811     Visit Number 3    Date for PT Re-Evaluation 03/10/23    Progress Note Due on Visit 10    PT Start Time 0805    PT Stop Time 0845    PT Time Calculation (min) 40 min    Activity Tolerance Patient tolerated treatment well    Behavior During Therapy WFL for tasks assessed/performed              Past Medical History:  Diagnosis Date   Arthritis    COPD (chronic obstructive pulmonary disease) ([place])    Diabetes mellitus type 2 in nonobese [name])    ED (erectile dysfunction)    Enlarged prostate    Hyperlipidemia    History reviewed. No pertinent surgical history. Patient Active Problem List   Diagnosis Date Noted   Diabetes mellitus type 2 in nonobese ([place]) 11/26/2022   Hyperlipidemia 11/10/2018   Seasonal allergies 11/10/2018   Benign prostatic hyperplasia with urinary frequency 07/07/2018   PAD (peripheral artery disease) ([place]) 07/07/2018   RLS (restless legs syndrome) 07/07/2018   Onychomycosis 07/07/2018   Chronic obstructive pulmonary disease ([place]) 07/07/2018    PCP: Riki Sheer, MD  REFERRING PROVIDER: Riki Sheer, MD  REFERRING DIAG: imbalance   THERAPY DIAG:  Impaired functional mobility, balance, and endurance  Pain of both hip joints  Chronic bilateral low back pain with bilateral sciatica  Pulmonary emphysema, unspecified emphysema type [facility])  Rationale for Evaluation and Treatment: Rehabilitation  ONSET DATE: Nov 2024  SUBJECTIVE:   SUBJECTIVE STATEMENT: Not very painful today PERTINENT HISTORY: COPD, diffuse arthritis, referred by PCP to assist with balance and arthritic assessment PAIN:  Are you having pain? Yes: NPRS scale: 3/10 Pain location: B hips, lower back, sides of legs, hands  Pain description:  everything hurts when I am still for a long time.  My hands cramp up and lock  Aggravating factors: prolonged sitting and lying  Relieving factors: rest,walking  PRECAUTIONS: Other: supplemental O2  WEIGHT BEARING RESTRICTIONS: No  FALLS:  Has patient fallen in last 6 months? No  LIVING ENVIRONMENT: Lives with: lives with their family and lives with their daughter Lives in: House/apartment Stairs: Yes: External: 3 steps; bilateral but cannot reach both Has following equipment at home:  supplemental O2 tank  OCCUPATION: retired  PLOF: Independent and Independent with basic ADLs  PATIENT GOALS: get more mobile, less stiff  NEXT MD VISIT: none scheduled currently  OBJECTIVE:   DIAGNOSTIC FINDINGS: na  PATIENT SURVEYS:  ABC scale 7.06% confidence LEFS 47/80  COGNITION: Overall cognitive status: Within functional limits for tasks assessed     SENSATION: WFL  EDEMA: no edema noted   MUSCLE LENGTH: Hamstrings: Right -10  deg; Left -10 deg   POSTURE: lean male, loss of lumbar lordosis noted.  B genuvarum  PALPATION: na  LOWER EXTREMITY ROM:all wfl   LOWER EXTREMITY MMT:  MMT Right eval Left eval  Hip flexion 5 5  Hip extension 3+ 4  Hip abduction 5 5  Hip adduction    Hip internal rotation    Hip external rotation    Knee flexion 5 5  Knee extension 5 5  Ankle dorsiflexion 5 5  Ankle plantarflexion    Ankle inversion    Ankle eversion     (Blank rows =  not tested) Functional testing:  lock bridge R 3+/5, L 4/5 Heel walking wnl Toe walking unable, poor control FUNCTIONAL TESTS:  5 times sit to stand: 10.88 Berg Balance Scale: 41/55  GAIT: Distance walked: 240' Assistive device utilized:  portable O2 tank Level of assistance: SBA Comments: gait speed 1.16mec   TODAY'S TREATMENT:                                                                                                                          DATE:  12/25/22: All ex performed with  patient utilizing continuous supplemental O2 3L, he checked pulse ox throughout Rx. Nustep level 5 Ue's and LE's 6 min Heal toe raises on airex 20x at counter, B UE support Seated B hamstring curls 15#, 3 sets 10 Seated B long arc quads, 15#, 3 sets 10 Seated rows, hands neutral 15# , 3 sets 10 Sit to stand, no hands, with green theraband resisted abd for proximal stability challenge, 10 x 2 Standing 3 way hip with 2# cuff weights, 10 each at counter with one hand support     12/23/22 Nustep level 4 6 min Standing toe raises 15 reps Standing heel raises Seated B hamstring curls, 15#, 3 sets 10 Seated long arc quads B 10#, 3 sets 10 Sit to stand no hands, isometric resistance with t band for hip abd for proximal stability 10 x  Standing 3 way hip 10 reps each leg 2#  Seated rows, hands neutral 15#, 10 x 2 Seated rows, palms horizontal  15#, 10 x 2  12/16/22: evaluation of balance, functional strength Instructed in toe raises and bridging for home, see ex list below    PATIENT EDUCATION:  Education details: POC goals Person educated: Patient Education method: Explanation, Demonstration, and Handouts Education comprehension: verbalized understanding  HOME EXERCISE PROGRAM: Access Code: [email or web address] URL: [email or web address] Date: 12/16/2022 Prepared by: AHayden Pedro Exercises - Supine Bridge  - 1 x daily - 7 x weekly - 3 sets - 10 reps - Heel Toe Raises with Unilateral Counter Support  - 1 x daily - 7 x weekly - 3 sets - 10 reps  ASSESSMENT:  CLINICAL IMPRESSION: Patient is a 77y.o. male who was seen today for physical therapy  treatment for stiffness, altered gait and activity tolerance with multiple joint pain, and higher level balance deficits. Progressed again  today with varied isotonic as well as closed chain strengthening for his LE's, and  postural strengthening. Increased the repetitions for some of the strengthening exercises thereby  also increasing his  cardiovascular demands.  He monitored his O2 saturation, which  at 95% each time with his supplemental O2. Continues to benefit from skilled PT to address his deficits.    OBJECTIVE IMPAIRMENTS: cardiopulmonary status limiting activity, decreased activity tolerance, decreased balance, decreased strength, and pain.   ACTIVITY LIMITATIONS: carrying, lifting, and stairs  PARTICIPATION LIMITATIONS: laundry, shopping, and community activity  PERSONAL FACTORS: Time since onset of injury/illness/exacerbation, Transportation, and 1-2  comorbidities: COPD, arthritis, DM  are also affecting patient's functional outcome.   REHAB POTENTIAL: Good  CLINICAL DECISION MAKING: Stable/uncomplicated  EVALUATION COMPLEXITY: Low   GOALS: Goals reviewed with patient? Yes  SHORT TERM GOALS: Target date: 12/30/22:   I initial HEP Baseline:initiated today Goal status: IN PROGRESS  LONG TERM GOALS: Target date: 03/10/23  Improve score on Berg form 41/56 to 46/56 for improved community ambulation Baseline: 41/56 Goal status: IN PROGRESS  2.  Improve strength for B hip extension and B plantarflexion to 5/5 Baseline: 3+ and 4/5 Goal status: IN PROGRESS  3.  Improve balance, able to tandem stand 30 sec for reduced fall risk Baseline: 4 sec Goal status: IN PROGRESS  PLAN:  PT FREQUENCY: 1-2x/week  PT DURATION: 12 weeks  PLANNED INTERVENTIONS: Therapeutic exercises, Therapeutic activity, Neuromuscular re-education, Balance training, Gait training, Patient/Family education, Self Care, Joint mobilization, and Prosthetic training  PLAN FOR NEXT SESSION: progress B LE posterior chain strength   Kinney Sackmann L Quinley Nesler, PT 12/25/2022, 8:31 AM

## 2022-12-25 NOTE — Telephone Encounter (Signed)
Spoke to [employer] at [employer].  She states they do have order and should send out today.  I called pt & made him aware.  Nothing further needed.

## 2022-12-25 NOTE — Telephone Encounter (Signed)
Patient states [facility] does not have the order for oxygen. Patient phone number is [phone].

## 2022-12-30 ENCOUNTER — Ambulatory Visit: Payer: Medicare HMO

## 2022-12-30 DIAGNOSIS — M5441 Lumbago with sciatica, right side: Secondary | ICD-10-CM | POA: Diagnosis not present

## 2022-12-30 DIAGNOSIS — M25551 Pain in right hip: Secondary | ICD-10-CM | POA: Diagnosis not present

## 2022-12-30 DIAGNOSIS — J439 Emphysema, unspecified: Secondary | ICD-10-CM | POA: Diagnosis not present

## 2022-12-30 DIAGNOSIS — G8929 Other chronic pain: Secondary | ICD-10-CM | POA: Diagnosis not present

## 2022-12-30 DIAGNOSIS — Z7409 Other reduced mobility: Secondary | ICD-10-CM

## 2022-12-30 DIAGNOSIS — M25552 Pain in left hip: Secondary | ICD-10-CM

## 2022-12-30 DIAGNOSIS — M1991 Primary osteoarthritis, unspecified site: Secondary | ICD-10-CM | POA: Diagnosis not present

## 2022-12-30 DIAGNOSIS — M5442 Lumbago with sciatica, left side: Secondary | ICD-10-CM | POA: Diagnosis not present

## 2022-12-30 NOTE — Therapy (Signed)
OUTPATIENT PHYSICAL THERAPY TREATMENT   Patient Name: Eugene Daniels MRN: [id number] DOB:Apr 12, 1946, 77 y.o., male Today's Date: 12/30/2022  END OF SESSION:  PT End of Session - 12/30/22 1451     Visit Number 4    Date for PT Re-Evaluation 03/10/23    Progress Note Due on Visit 10    PT Start Time 1447    PT Stop Time 1530    PT Time Calculation (min) 43 min    Activity Tolerance Patient tolerated treatment well    Behavior During Therapy WFL for tasks assessed/performed               Past Medical History:  Diagnosis Date   Arthritis    COPD (chronic obstructive pulmonary disease) ([place])    Diabetes mellitus type 2 in nonobese [facility])    ED (erectile dysfunction)    Enlarged prostate    Hyperlipidemia    History reviewed. No pertinent surgical history. Patient Active Problem List   Diagnosis Date Noted   Diabetes mellitus type 2 in nonobese ([place]) 11/26/2022   Hyperlipidemia 11/10/2018   Seasonal allergies 11/10/2018   Benign prostatic hyperplasia with urinary frequency 07/07/2018   PAD (peripheral artery disease) ([place]) 07/07/2018   RLS (restless legs syndrome) 07/07/2018   Onychomycosis 07/07/2018   Chronic obstructive pulmonary disease ([place]) 07/07/2018    PCP: Riki Sheer, MD  REFERRING PROVIDER: Riki Sheer, MD  REFERRING DIAG: imbalance   THERAPY DIAG:  Impaired functional mobility, balance, and endurance  Pain of both hip joints  Chronic bilateral low back pain with bilateral sciatica  Pulmonary emphysema, unspecified emphysema type [facility])  Rationale for Evaluation and Treatment: Rehabilitation  ONSET DATE: Nov 2024  SUBJECTIVE:   SUBJECTIVE STATEMENT: Doing good no pain today. PERTINENT HISTORY: COPD, diffuse arthritis, referred by PCP to assist with balance and arthritic assessment PAIN:  Are you having pain? Yes: NPRS scale: 3/10 Pain location: B hips, lower back, sides of legs, hands  Pain description: everything hurts  when I am still for a long time.  My hands cramp up and lock  Aggravating factors: prolonged sitting and lying  Relieving factors: rest,walking  PRECAUTIONS: Other: supplemental O2  WEIGHT BEARING RESTRICTIONS: No  FALLS:  Has patient fallen in last 6 months? No  LIVING ENVIRONMENT: Lives with: lives with their family and lives with their daughter Lives in: House/apartment Stairs: Yes: External: 3 steps; bilateral but cannot reach both Has following equipment at home:  supplemental O2 tank  OCCUPATION: retired  PLOF: Independent and Independent with basic ADLs  PATIENT GOALS: get more mobile, less stiff  NEXT MD VISIT: none scheduled currently  OBJECTIVE:   DIAGNOSTIC FINDINGS: na  PATIENT SURVEYS:  ABC scale 7.06% confidence LEFS 47/80  COGNITION: Overall cognitive status: Within functional limits for tasks assessed     SENSATION: WFL  EDEMA: no edema noted   MUSCLE LENGTH: Hamstrings: Right -10  deg; Left -10 deg   POSTURE: lean male, loss of lumbar lordosis noted.  B genuvarum  PALPATION: na  LOWER EXTREMITY ROM:all wfl   LOWER EXTREMITY MMT:  MMT Right eval Left eval  Hip flexion 5 5  Hip extension 3+ 4  Hip abduction 5 5  Hip adduction    Hip internal rotation    Hip external rotation    Knee flexion 5 5  Knee extension 5 5  Ankle dorsiflexion 5 5  Ankle plantarflexion    Ankle inversion    Ankle eversion     (Blank rows =  not tested) Functional testing:  lock bridge R 3+/5, L 4/5 Heel walking wnl Toe walking unable, poor control FUNCTIONAL TESTS:  5 times sit to stand: 10.88 Berg Balance Scale: 41/55  GAIT: Distance walked: 240' Assistive device utilized:  portable O2 tank Level of assistance: SBA Comments: gait speed 1.24msec   TODAY'S TREATMENT:                                                                                                                          DATE:  12/30/22 All exercises with supplemental O2: Nustep  L5x78min  Heel raise x 10 at counter  Seated lat pull downs 15# x 20 Seated rows 20# 2x10- cues for upright posture with chest unsupported Knee flexion 15# 3x12 BLE Knee extension 15# 2x12 BLE STS with green TB at knees x 10 STS with ball squeeze at knees x 10 Forward steps with arm reach fwd x 10 bil  12/25/22: All ex performed with patient utilizing continuous supplemental O2 3L, he checked pulse ox throughout Rx. Nustep level 5 Ue's and LE's 6 min Heal toe raises on airex 20x at counter, B UE support Seated B hamstring curls 15#, 3 sets 10 Seated B long arc quads, 15#, 3 sets 10 Seated rows, hands neutral 15# , 3 sets 10 Sit to stand, no hands, with green theraband resisted abd for proximal stability challenge, 10 x 2 Standing 3 way hip with 2# cuff weights, 10 each at counter with one hand support     12/23/22 Nustep level 4 6 min Standing toe raises 15 reps Standing heel raises Seated B hamstring curls, 15#, 3 sets 10 Seated long arc quads B 10#, 3 sets 10 Sit to stand no hands, isometric resistance with t band for hip abd for proximal stability 10 x  Standing 3 way hip 10 reps each leg 2#  Seated rows, hands neutral 15#, 10 x 2 Seated rows, palms horizontal  15#, 10 x 2  12/16/22: evaluation of balance, functional strength Instructed in toe raises and bridging for home, see ex list below    PATIENT EDUCATION:  Education details: POC goals Person educated: Patient Education method: Explanation, Demonstration, and Handouts Education comprehension: verbalized understanding  HOME EXERCISE PROGRAM: Access Code: [email or web address] URL: [email or web address] Date: 12/16/2022 Prepared by: Hayden Pedro  Exercises - Supine Bridge  - 1 x daily - 7 x weekly - 3 sets - 10 reps - Heel Toe Raises with Unilateral Counter Support  - 1 x daily - 7 x weekly - 3 sets - 10 reps  ASSESSMENT:  CLINICAL IMPRESSION: Patient is a 77 y.o. male who was seen today for physical therapy   treatment for stiffness, altered gait and activity tolerance with multiple joint pain, and higher level balance deficits. Continued progressing exercises to work on activity tolerance for cardiopulmonary endurance and LE strength. Overall he responded well to treatment and would continue to benefit from skilled therapy.    OBJECTIVE IMPAIRMENTS: cardiopulmonary status limiting activity,  decreased activity tolerance, decreased balance, decreased strength, and pain.   ACTIVITY LIMITATIONS: carrying, lifting, and stairs  PARTICIPATION LIMITATIONS: laundry, shopping, and community activity  PERSONAL FACTORS: Time since onset of injury/illness/exacerbation, Transportation, and 1-2 comorbidities: COPD, arthritis, DM  are also affecting patient's functional outcome.   REHAB POTENTIAL: Good  CLINICAL DECISION MAKING: Stable/uncomplicated  EVALUATION COMPLEXITY: Low   GOALS: Goals reviewed with patient? Yes  SHORT TERM GOALS: Target date: 12/30/22:   I initial HEP Baseline:initiated today Goal status: MET- 12/30/22  LONG TERM GOALS: Target date: 03/10/23  Improve score on Berg form 41/56 to 46/56 for improved community ambulation Baseline: 41/56 Goal status: IN PROGRESS  2.  Improve strength for B hip extension and B plantarflexion to 5/5 Baseline: 3+ and 4/5 Goal status: IN PROGRESS  3.  Improve balance, able to tandem stand 30 sec for reduced fall risk Baseline: 4 sec Goal status: IN PROGRESS  PLAN:  PT FREQUENCY: 1-2x/week  PT DURATION: 12 weeks  PLANNED INTERVENTIONS: Therapeutic exercises, Therapeutic activity, Neuromuscular re-education, Balance training, Gait training, Patient/Family education, Self Care, Joint mobilization, and Prosthetic training  PLAN FOR NEXT SESSION: progress B LE posterior chain strength   Artist Pais, PTA 12/30/2022, 3:32 PM

## 2023-01-01 ENCOUNTER — Other Ambulatory Visit: Payer: Self-pay

## 2023-01-01 ENCOUNTER — Ambulatory Visit: Payer: Medicare HMO

## 2023-01-01 DIAGNOSIS — M5441 Lumbago with sciatica, right side: Secondary | ICD-10-CM | POA: Diagnosis not present

## 2023-01-01 DIAGNOSIS — Z7409 Other reduced mobility: Secondary | ICD-10-CM

## 2023-01-01 DIAGNOSIS — M25551 Pain in right hip: Secondary | ICD-10-CM | POA: Diagnosis not present

## 2023-01-01 DIAGNOSIS — M1991 Primary osteoarthritis, unspecified site: Secondary | ICD-10-CM | POA: Diagnosis not present

## 2023-01-01 DIAGNOSIS — J439 Emphysema, unspecified: Secondary | ICD-10-CM | POA: Diagnosis not present

## 2023-01-01 DIAGNOSIS — M5442 Lumbago with sciatica, left side: Secondary | ICD-10-CM | POA: Diagnosis not present

## 2023-01-01 DIAGNOSIS — G8929 Other chronic pain: Secondary | ICD-10-CM | POA: Diagnosis not present

## 2023-01-01 DIAGNOSIS — M25552 Pain in left hip: Secondary | ICD-10-CM | POA: Diagnosis not present

## 2023-01-01 NOTE — Therapy (Signed)
OUTPATIENT PHYSICAL THERAPY TREATMENT   Patient Name: Eugene Daniels MRN: [id number] DOB:17-Sep-1946, 77 y.o., male Today's Date: 01/01/2023  END OF SESSION:  PT End of Session - 01/01/23 1515     Visit Number 5    Date for PT Re-Evaluation 03/10/23    Progress Note Due on Visit 10    PT Start Time 1435    PT Stop Time 1512    PT Time Calculation (min) 37 min    Activity Tolerance Patient tolerated treatment well;Patient limited by pain               Past Medical History:  Diagnosis Date   Arthritis    COPD (chronic obstructive pulmonary disease) ([place])    Diabetes mellitus type 2 in nonobese [facility])    ED (erectile dysfunction)    Enlarged prostate    Hyperlipidemia    History reviewed. No pertinent surgical history. Patient Active Problem List   Diagnosis Date Noted   Diabetes mellitus type 2 in nonobese ([place]) 11/26/2022   Hyperlipidemia 11/10/2018   Seasonal allergies 11/10/2018   Benign prostatic hyperplasia with urinary frequency 07/07/2018   PAD (peripheral artery disease) ([place]) 07/07/2018   RLS (restless legs syndrome) 07/07/2018   Onychomycosis 07/07/2018   Chronic obstructive pulmonary disease ([place]) 07/07/2018    PCP: Riki Sheer, MD  REFERRING PROVIDER: Riki Sheer, MD  REFERRING DIAG: imbalance   THERAPY DIAG:  Impaired functional mobility, balance, and endurance  Pain of both hip joints  Chronic bilateral low back pain with bilateral sciatica  Pulmonary emphysema, unspecified emphysema type [facility])  Rationale for Evaluation and Treatment: Rehabilitation  ONSET DATE: Nov 2024  SUBJECTIVE:   SUBJECTIVE STATEMENT: Have a headache today that is pretty strong,R temple, started when I left the house PERTINENT HISTORY: COPD, diffuse arthritis, referred by PCP to assist with balance and arthritic assessment PAIN:  Are you having pain? Yes: NPRS scale: 3/10 Pain location: B hips, lower back, sides of legs, hands  Pain description:  everything hurts when I am still for a long time.  My hands cramp up and lock  Aggravating factors: prolonged sitting and lying  Relieving factors: rest,walking  PRECAUTIONS: Other: supplemental O2  WEIGHT BEARING RESTRICTIONS: No  FALLS:  Has patient fallen in last 6 months? No  LIVING ENVIRONMENT: Lives with: lives with their family and lives with their daughter Lives in: House/apartment Stairs: Yes: External: 3 steps; bilateral but cannot reach both Has following equipment at home:  supplemental O2 tank  OCCUPATION: retired  PLOF: Independent and Independent with basic ADLs  PATIENT GOALS: get more mobile, less stiff  NEXT MD VISIT: none scheduled currently  OBJECTIVE:   DIAGNOSTIC FINDINGS: na  PATIENT SURVEYS:  ABC scale 7.06% confidence LEFS 47/80  COGNITION: Overall cognitive status: Within functional limits for tasks assessed     SENSATION: WFL  EDEMA: no edema noted   MUSCLE LENGTH: Hamstrings: Right -10  deg; Left -10 deg   POSTURE: lean male, loss of lumbar lordosis noted.  B genuvarum  PALPATION: na  LOWER EXTREMITY ROM:all wfl   LOWER EXTREMITY MMT:  MMT Right eval Left eval  Hip flexion 5 5  Hip extension 3+ 4  Hip abduction 5 5  Hip adduction    Hip internal rotation    Hip external rotation    Knee flexion 5 5  Knee extension 5 5  Ankle dorsiflexion 5 5  Ankle plantarflexion    Ankle inversion    Ankle eversion     (  Blank rows = not tested) Functional testing:  lock bridge R 3+/5, L 4/5 Heel walking wnl Toe walking unable, poor control FUNCTIONAL TESTS:  5 times sit to stand: 10.88 Berg Balance Scale: 41/55  GAIT: Distance walked: 240' Assistive device utilized:  portable O2 tank Level of assistance: SBA Comments: gait speed 1.8msec   TODAY'S TREATMENT:                                                                                                                          DATE:  01/01/23: All exercises with  supplemental O2: Nustep L5 x 6 min  Heel raise x 10 at counter with B 2# cuff weights ankles Seated lat pull downs 20#, 3 x 10 Seated rows 20# 3x10- hands neutral, and palms down. cKnee flexion 15# 3x12 BLE 3 way hip , with 2# cuff wts ankles, 10 x each side at counter one hand support   12/30/22 All exercises with supplemental O2: Nustep L5x26min  Heel raise x 10 at counter  Seated lat pull downs 15# x 20 Seated rows 20# 2x10- cues for upright posture with chest unsupported Knee flexion 15# 3x12 BLE Knee extension 15# 2x12 BLE STS with green TB at knees x 10 STS with ball squeeze at knees x 10 Forward steps with arm reach fwd x 10 bil  12/25/22: All ex performed with patient utilizing continuous supplemental O2 3L, he checked pulse ox throughout Rx. Nustep level 5 Ue's and LE's 6 min Heal toe raises on airex 20x at counter, B UE support Seated B hamstring curls 15#, 3 sets 10 Seated B long arc quads, 15#, 3 sets 10 Seated rows, hands neutral 15# , 3 sets 10 Sit to stand, no hands, with green theraband resisted abd for proximal stability challenge, 10 x 2 Standing 3 way hip with 2# cuff weights, 10 each at counter with one hand support     12/23/22 Nustep level 4 6 min Standing toe raises 15 reps Standing heel raises Seated B hamstring curls, 15#, 3 sets 10 Seated long arc quads B 10#, 3 sets 10 Sit to stand no hands, isometric resistance with t band for hip abd for proximal stability 10 x  Standing 3 way hip 10 reps each leg 2#  Seated rows, hands neutral 15#, 10 x 2 Seated rows, palms horizontal  15#, 10 x 2  12/16/22: evaluation of balance, functional strength Instructed in toe raises and bridging for home, see ex list below    PATIENT EDUCATION:  Education details: POC goals Person educated: Patient Education method: Explanation, Demonstration, and Handouts Education comprehension: verbalized understanding  HOME EXERCISE PROGRAM: Access Code: [email or web address] URL:  [email or web address] Date: 12/16/2022 Prepared by: Hayden Pedro  Exercises - Supine Bridge  - 1 x daily - 7 x weekly - 3 sets - 10 reps - Heel Toe Raises with Unilateral Counter Support  - 1 x daily - 7 x weekly - 3 sets - 10 reps  ASSESSMENT:  CLINICAL  IMPRESSION: Patient is a 77 y.o. male who was seen today for physical therapy  treatment for stiffness, altered gait and activity tolerance with multiple joint pain, and higher level balance deficits. Continued progressing exercises to work on activity tolerance for cardiopulmonary endurance and LE strength.  He arrived with a headache today, provided him with 2 glasses of water. Checked his O2 sats several times and at 97%. Headache improved through treatment but patient deferred some of the LE strengthening at end. Overall he responded well to treatment and would continue to benefit from skilled therapy.    OBJECTIVE IMPAIRMENTS: cardiopulmonary status limiting activity, decreased activity tolerance, decreased balance, decreased strength, and pain.   ACTIVITY LIMITATIONS: carrying, lifting, and stairs  PARTICIPATION LIMITATIONS: laundry, shopping, and community activity  PERSONAL FACTORS: Time since onset of injury/illness/exacerbation, Transportation, and 1-2 comorbidities: COPD, arthritis, DM  are also affecting patient's functional outcome.   REHAB POTENTIAL: Good  CLINICAL DECISION MAKING: Stable/uncomplicated  EVALUATION COMPLEXITY: Low   GOALS: Goals reviewed with patient? Yes  SHORT TERM GOALS: Target date: 12/30/22:   I initial HEP Baseline:initiated today Goal status: MET- 12/30/22  LONG TERM GOALS: Target date: 03/10/23  Improve score on Berg form 41/56 to 46/56 for improved community ambulation Baseline: 41/56 Goal status: IN PROGRESS  2.  Improve strength for B hip extension and B plantarflexion to 5/5 Baseline: 3+ and 4/5 Goal status: IN PROGRESS  3.  Improve balance, able to tandem stand 30 sec  for reduced fall risk Baseline: 4 sec Goal status: IN PROGRESS  PLAN:  PT FREQUENCY: 1-2x/week  PT DURATION: 12 weeks  PLANNED INTERVENTIONS: Therapeutic exercises, Therapeutic activity, Neuromuscular re-education, Balance training, Gait training, Patient/Family education, Self Care, Joint mobilization, and Prosthetic training  PLAN FOR NEXT SESSION: progress B LE posterior chain strength   Marcelene Weidemann L Sirron Francesconi, PT 01/01/2023, 3:17 PM

## 2023-01-06 ENCOUNTER — Ambulatory Visit: Payer: Medicare HMO

## 2023-01-06 DIAGNOSIS — G8929 Other chronic pain: Secondary | ICD-10-CM

## 2023-01-06 DIAGNOSIS — M25551 Pain in right hip: Secondary | ICD-10-CM | POA: Diagnosis not present

## 2023-01-06 DIAGNOSIS — M5441 Lumbago with sciatica, right side: Secondary | ICD-10-CM | POA: Diagnosis not present

## 2023-01-06 DIAGNOSIS — M5442 Lumbago with sciatica, left side: Secondary | ICD-10-CM | POA: Diagnosis not present

## 2023-01-06 DIAGNOSIS — Z7409 Other reduced mobility: Secondary | ICD-10-CM

## 2023-01-06 DIAGNOSIS — J439 Emphysema, unspecified: Secondary | ICD-10-CM | POA: Diagnosis not present

## 2023-01-06 DIAGNOSIS — M1991 Primary osteoarthritis, unspecified site: Secondary | ICD-10-CM | POA: Diagnosis not present

## 2023-01-06 DIAGNOSIS — M25552 Pain in left hip: Secondary | ICD-10-CM | POA: Diagnosis not present

## 2023-01-06 NOTE — Therapy (Addendum)
OUTPATIENT PHYSICAL THERAPY TREATMENT   Patient Name: Eugene Daniels MRN: [id number] DOB:[birth year], 77 y.o., male Today's Date: 01/06/2023  END OF SESSION:  PT End of Session - 01/06/23 1451     Visit Number 6    Date for PT Re-Evaluation 03/10/23    PT Start Time 1448    PT Stop Time 1530    PT Time Calculation (min) 42 min    Activity Tolerance Patient tolerated treatment well;Patient limited by pain    Behavior During Therapy [employer] for tasks assessed/performed               Past Medical History:  Diagnosis Date   Arthritis    COPD (chronic obstructive pulmonary disease) (HCC)    Diabetes mellitus type 2 in nonobese [facility])    ED (erectile dysfunction)    Enlarged prostate    Hyperlipidemia    History reviewed. No pertinent surgical history. Patient Active Problem List   Diagnosis Date Noted   Diabetes mellitus type 2 in nonobese (HCC) 11/26/2022   Hyperlipidemia 11/10/2018   Seasonal allergies 11/10/2018   Benign prostatic hyperplasia with urinary frequency 07/07/2018   PAD (peripheral artery disease) (HCC) 07/07/2018   RLS (restless legs syndrome) 07/07/2018   Onychomycosis 07/07/2018   Chronic obstructive pulmonary disease (HCC) 07/07/2018    PCP: Arva Chafe, MD  REFERRING PROVIDER: Arva Chafe, MD  REFERRING DIAG: imbalance   THERAPY DIAG:  Impaired functional mobility, balance, and endurance  Pain of both hip joints  Chronic bilateral low back pain with bilateral sciatica  Pulmonary emphysema, unspecified emphysema type [facility])  Rationale for Evaluation and Treatment: Rehabilitation  ONSET DATE: Nov 2024  SUBJECTIVE:   SUBJECTIVE STATEMENT: No headache now, feel better since I left the house  PERTINENT HISTORY: COPD, diffuse arthritis, referred by PCP to assist with balance and arthritic assessment PAIN:  Are you having pain? Yes: NPRS scale: 0/10 Pain location: B hips, lower back, sides of legs, hands  Pain description:  everything hurts when I am still for a long time.  My hands cramp up and lock  Aggravating factors: prolonged sitting and lying  Relieving factors: rest,walking  PRECAUTIONS: Other: supplemental O2  WEIGHT BEARING RESTRICTIONS: No  FALLS:  Has patient fallen in last 6 months? No  LIVING ENVIRONMENT: Lives with: lives with their family and lives with their daughter Lives in: House/apartment Stairs: Yes: External: 3 steps; bilateral but cannot reach both Has following equipment at home:  supplemental O2 tank  OCCUPATION: retired  PLOF: Independent and Independent with basic ADLs  PATIENT GOALS: get more mobile, less stiff  NEXT MD VISIT: none scheduled currently  OBJECTIVE:   DIAGNOSTIC FINDINGS: na  PATIENT SURVEYS:  ABC scale 7.06% confidence LEFS 47/80  COGNITION: Overall cognitive status: Within functional limits for tasks assessed     SENSATION: WFL  EDEMA: no edema noted   MUSCLE LENGTH: Hamstrings: Right -10  deg; Left -10 deg   POSTURE: lean male, loss of lumbar lordosis noted.  B genuvarum  PALPATION: na  LOWER EXTREMITY ROM:all wfl   LOWER EXTREMITY MMT:  MMT Right eval Left eval  Hip flexion 5 5  Hip extension 3+ 4  Hip abduction 5 5  Hip adduction    Hip internal rotation    Hip external rotation    Knee flexion 5 5  Knee extension 5 5  Ankle dorsiflexion 5 5  Ankle plantarflexion    Ankle inversion    Ankle eversion     (Blank rows =  not tested) Functional testing:  lock bridge R 3+/5, L 4/5 Heel walking wnl Toe walking unable, poor control FUNCTIONAL TESTS:  5 times sit to stand: 10.88 Berg Balance Scale: 41/55  GAIT: Distance walked: 240' Assistive device utilized:  portable O2 tank Level of assistance: SBA Comments: gait speed 1.62msec   TODAY'S TREATMENT DATE: :   01/06/23: Therapeutic exercise:utilized for endurance, Le and core strengthening and flexibility All performed with 3L on demand O2 Nustep level 3 6 min,  Ue's and LE's Seated rows, hands neutral and palms down 20 #, 10 x 3 sets eac Standing alt hip abd with green t band resistance Standing alt hip ext with green t band resistance  Seated B knee flexion 20#  Seated knee extension 20# Forward step ups with opposite knee drivers 10 x each, one hand support  Heel toe raises on airex 20x Sit to stand with 2# (red) mediball 10x   01/01/23: All exercises with supplemental O2: Nustep L5 x 6 min  Heel raise x 10 at counter with B 2# cuff weights ankles Seated lat pull downs 20#, 3 x 10 Seated rows 20# 3x10- hands neutral, and palms down. cKnee flexion 15# 3x12 BLE 3 way hip , with 2# cuff wts ankles, 10 x each side at counter one hand support   12/30/22 All exercises with supplemental O2: Nustep L5x66min  Heel raise x 10 at counter  Seated lat pull downs 15# x 20 Seated rows 20# 2x10- cues for upright posture with chest unsupported Knee flexion 15# 3x12 BLE Knee extension 15# 2x12 BLE STS with green TB at knees x 10 STS with ball squeeze at knees x 10 Forward steps with arm reach fwd x 10 bil  12/25/22: All ex performed with patient utilizing continuous supplemental O2 3L, he checked pulse ox throughout Rx. Nustep level 5 Ue's and LE's 6 min Heal toe raises on airex 20x at counter, B UE support Seated B hamstring curls 15#, 3 sets 10 Seated B long arc quads, 15#, 3 sets 10 Seated rows, hands neutral 15# , 3 sets 10 Sit to stand, no hands, with green theraband resisted abd for proximal stability challenge, 10 x 2 Standing 3 way hip with 2# cuff weights, 10 each at counter with one hand support     12/23/22 Nustep level 4 6 min Standing toe raises 15 reps Standing heel raises Seated B hamstring curls, 15#, 3 sets 10 Seated long arc quads B 10#, 3 sets 10 Sit to stand no hands, isometric resistance with t band for hip abd for proximal stability 10 x  Standing 3 way hip 10 reps each leg 2#  Seated rows, hands neutral 15#, 10 x  2 Seated rows, palms horizontal  15#, 10 x 2  12/16/22: evaluation of balance, functional strength Instructed in toe raises and bridging for home, see ex list below    PATIENT EDUCATION:  Education details: POC goals Person educated: Patient Education method: Explanation, Demonstration, and Handouts Education comprehension: verbalized understanding  HOME EXERCISE PROGRAM: Access Code: [email or web address] URL: [email or web address] Date: 01/06/2023 Prepared by: Caralee Ates  Exercises - Supine Bridge  - 1 x daily - 7 x weekly - 3 sets - 10 reps - Heel Toe Raises with Unilateral Counter Support  - 1 x daily - 7 x weekly - 3 sets - 10 reps - Side Stepping with Resistance at Thighs  - 1 x daily - 7 x weekly - 3 sets - 10 reps - Hip Extension with Resistance  Loop  - 1 x daily - 7 x weekly - 3 sets - 10 reps - Sit to Stand Without Arm Support  - 1 x daily - 7 x weekly - 3 sets - 10 reps Access Code: [email or web address] URL: [email or web address] Date: 12/16/2022 Prepared by: Caralee Ates  Exercises - Supine Bridge  - 1 x daily - 7 x weekly - 3 sets - 10 reps - Heel Toe Raises with Unilateral Counter Support  - 1 x daily - 7 x weekly - 3 sets - 10 reps  ASSESSMENT:  CLINICAL IMPRESSION: Patient is a 77 y.o. male who was seen today for physical therapy  treatment for stiffness, altered gait and activity tolerance with multiple joint pain, and higher level balance deficits. Continued progressing exercises to work on activity tolerance for cardiopulmonary endurance and LE strength.  No headache today, able to increase complexity and resistance to exercise.  At the end of session the patient indicated that he felt ready now to hold PT and continue with home program.  Advised that we would place him on hold through the plan of care,  May 2024.  Overall he responded well to treatment . Will place on hold and await further word from him.   OBJECTIVE IMPAIRMENTS: cardiopulmonary status  limiting activity, decreased activity tolerance, decreased balance, decreased strength, and pain.   ACTIVITY LIMITATIONS: carrying, lifting, and stairs  PARTICIPATION LIMITATIONS: laundry, shopping, and community activity  PERSONAL FACTORS: Time since onset of injury/illness/exacerbation, Transportation, and 1-2 comorbidities: COPD, arthritis, DM  are also affecting patient's functional outcome.   REHAB POTENTIAL: Good  CLINICAL DECISION MAKING: Stable/uncomplicated  EVALUATION COMPLEXITY: Low   GOALS: Goals reviewed with patient? Yes  SHORT TERM GOALS: Target date: 12/30/22:   I initial HEP Baseline:initiated today Goal status: MET- 12/30/22  LONG TERM GOALS: Target date: 03/10/23  Improve score on Berg form 41/56 to 46/56 for improved community ambulation Baseline: 41/56 Goal status: IN PROGRESS  2.  Improve strength for B hip extension and B plantarflexion to 5/5 Baseline: 3+ and 4/5 01/06/23:  much impoved  Goal status: IN PROGRESS  3.  Improve balance, able to tandem stand 30 sec for reduced fall risk Baseline: 4 sec Goal status: IN PROGRESS  PLAN:  PT FREQUENCY: 1-2x/week  PT DURATION: 12 weeks  PLANNED INTERVENTIONS: Therapeutic exercises, Therapeutic activity, Neuromuscular re-education, Balance training, Gait training, Patient/Family education, Self Care, Joint mobilization, and Prosthetic training  PLAN FOR NEXT SESSION: Hold current PT program and patient to attempt at home.   Blayke Cordrey Frazier Richards, PT 01/06/2023, 3:44 PM    04/30/23:  No further appts scheduled DC from PT for this episode at this time Menelik Mcfarren Frazier Richards, PT, DPT Board-certified Specialist in Orthopaedic Physical Therapy

## 2023-01-26 DIAGNOSIS — J449 Chronic obstructive pulmonary disease, unspecified: Secondary | ICD-10-CM | POA: Diagnosis not present

## 2023-01-28 ENCOUNTER — Telehealth: Payer: Self-pay | Admitting: Pulmonary Disease

## 2023-01-28 NOTE — Telephone Encounter (Signed)
Patient called to inform the nurse or doctor that he is having problems with his breathing and would like Prednisone and an antibiotic sent to pharmacy.  Please advise and call patient to discuss at [phone]

## 2023-01-28 NOTE — Telephone Encounter (Signed)
Patient has been scheduled for apt this Friday. NFN

## 2023-01-28 NOTE — Telephone Encounter (Signed)
Patient complains of a dry cough, sob, nasal congestion. Symptoms present x 2 weeks Pt is requesting prednisone and antibiotic  Maralyn Sago can you please advise?  Ph[facility]acy [facility] in [place] on [address]

## 2023-01-30 ENCOUNTER — Encounter: Payer: Self-pay | Admitting: Nurse Practitioner

## 2023-01-30 ENCOUNTER — Ambulatory Visit (INDEPENDENT_AMBULATORY_CARE_PROVIDER_SITE_OTHER): Payer: No Typology Code available for payment source | Admitting: Nurse Practitioner

## 2023-01-30 ENCOUNTER — Telehealth: Payer: Self-pay

## 2023-01-30 ENCOUNTER — Ambulatory Visit (INDEPENDENT_AMBULATORY_CARE_PROVIDER_SITE_OTHER): Payer: No Typology Code available for payment source

## 2023-01-30 VITALS — BP 160/90 | HR 83 | Temp 98.0°F | Ht 66.0 in | Wt 174.2 lb

## 2023-01-30 DIAGNOSIS — R0602 Shortness of breath: Secondary | ICD-10-CM | POA: Diagnosis not present

## 2023-01-30 DIAGNOSIS — J441 Chronic obstructive pulmonary disease with (acute) exacerbation: Secondary | ICD-10-CM

## 2023-01-30 DIAGNOSIS — J9611 Chronic respiratory failure with hypoxia: Secondary | ICD-10-CM | POA: Diagnosis not present

## 2023-01-30 DIAGNOSIS — R059 Cough, unspecified: Secondary | ICD-10-CM | POA: Diagnosis not present

## 2023-01-30 DIAGNOSIS — J9811 Atelectasis: Secondary | ICD-10-CM | POA: Diagnosis not present

## 2023-01-30 MED ORDER — METHYLPREDNISOLONE ACETATE 80 MG/ML IJ SUSP
80.0000 mg | Freq: Once | INTRAMUSCULAR | Status: AC
Start: 2023-01-30 — End: 2023-01-30
  Administered 2023-01-30: 80 mg via INTRAMUSCULAR

## 2023-01-30 MED ORDER — PREDNISONE 10 MG PO TABS: ORAL_TABLET | ORAL | 0 refills | Status: AC

## 2023-01-30 MED ORDER — BREZTRI AEROSPHERE 160-9-4.8 MCG/ACT IN AERO: 2.0000 | INHALATION_SPRAY | Freq: Two times a day (BID) | RESPIRATORY_TRACT | 0 refills | Status: DC

## 2023-01-30 MED ORDER — AZITHROMYCIN 250 MG PO TABS: ORAL_TABLET | ORAL | 0 refills | Status: AC

## 2023-01-30 NOTE — Telephone Encounter (Signed)
Left message for patient to call clinic.  Patient was wheeled out to waiting room and taken to check out.  Patient was waiting for his daughter to pick him up.  Patient left clinic without getting labs drawn today.  I tried to reach patient before he left parking lot, but was unsuccessful in letting patient know he needs blood work. Spoke with patient on phone and patient wants labs called into [facility] in [place], [place].  Per Rhunette Croft, NP, patient needs to go to a [facility] lab to get results quickly.  Patient in [place], [place].  Patient can go to [facility] on [address] in [place]. Tried to call patient x 2. No answer.  Left detailed message for patient to go to [facility] and ask for the Lab department.  Will leave orders as is for now in [facility].  Will change when patient decides where he wants to go to get labs.   Note:  Rhunette Croft, NP would like labs drawn today before patient starts prednisone taper as this will affect lab results.

## 2023-01-30 NOTE — Progress Notes (Signed)
  ID: Eugene Daniels, male    DOB: [birth year], 77 y.o.   MRN: [id number]  Chief Complaint  Patient presents with   Acute Visit    SOB and Cough, unable to produce any mucus x 3 weeks.      Referring provider: Sharlene Dory*  HPI: 77 year old male, former smoker followed for COPD and chronic respiratory failure on supplemental O2. He is a patient of Dr. Laurena Spies and last seen in office 12/18/2022. Hospitalized 09/01/2022 and 12/02/2022. Past medical history significant for PAD, DM, BPH, RLS, HLD, seasonal allergies.   TEST/EVENTS:  11/28/2022 echo: findings of reverse Takotsubo. EF 45-50%. RV size and function nl. 11/28/2022 CTA chest: CAD, atherosclerosis. No PE. No LAD. Emphysematous changes. Bronchial wall thickening b/l and mucus plugging RLL. Mild atelectasis or scarring in lingular segment of LUL. No acute process. Possible underlying cirrhosis.   12/18/2022: OV with Dr. Judeth Horn. Returns to clinic after prolonged absence. He was hospitalized at [facility] 08/2022 with COVID, AECOPD requiring O2. He was discharged on 2 lpm supplemental O2, new requirement. He was admitted again 11/2022 for AECOPD. Discharged on 3 lpm. Received O2 from the [place] but they cannot access POC. He is close to running out of oxygen as the [place] has not serviced them. POC qualification completed. He is on Breo and spiriva for AECOPD. Could consider neb therapies if exacerbations continue.  01/30/2023: Today - acute Patient presents today for acute visit. He has been having trouble with his breathing for the past 3 weeks. He feels very short winded and gets out of breath just walking to the bathroom. He has a persistent congested cough but has been unable to produce any sputum. He has noticed wheezing and chest feels tight. He denies any fevers, chills, hemoptysis, leg swelling, orthopnea. He did a breathing treatment this morning. He uses his Breo and his Spiriva. He is wearing his oxygen 4 lpm. Has  not been checking his sats at home.   No Known Allergies  Immunization History  Administered Date(s) Administered   Fluad Quad(high Dose 65+) 07/10/2020, 08/22/2021   Hep A / Hep B 02/07/2013, 11/02/2013   Influenza, High Dose Seasonal PF 08/06/2018, 08/16/2019, 08/11/2022   Influenza,inj,Quad PF,6+ Mos 12/15/2022   Influenza-Unspecified 07/13/2012, 08/13/2014, 12/12/2015, 08/18/2016   Moderna Sars-Covid-2 Vaccination 11/26/2019, 12/24/2019, 01/08/2021   Pneumococcal Conjugate-13 08/14/2015, 08/06/2018   Pneumococcal Polysaccharide-23 05/22/2016, 07/27/2019   RSV,unspecified 10/14/2022   Rsv, Bivalent, Protein Subunit Rsvpref,pf Verdis Frederickson) 10/14/2022   Tdap 11/02/2013, 08/06/2018   Zoster Recombinat (Shingrix) 06/13/2020, 08/16/2020   Zoster, Unspecified 06/13/2020, 08/16/2020    Past Medical History:  Diagnosis Date   Arthritis    COPD (chronic obstructive pulmonary disease)    Diabetes mellitus type 2 in nonobese    ED (erectile dysfunction)    Enlarged prostate    Hyperlipidemia     Tobacco History: Social History   Tobacco Use  Smoking Status Former   Packs/day: 1.00   Years: 30.00   Additional pack years: 0.00   Total pack years: 30.00   Types: Cigarettes  Smokeless Tobacco Never  Tobacco Comments   Quit 2010   Counseling given: Not Answered Tobacco comments: Quit 2010   Outpatient Medications Prior to Visit  Medication Sig Dispense Refill   albuterol (PROAIR HFA) 108 (90 Base) MCG/ACT inhaler Inhale 1-2 puffs into the lungs every 4 (four) hours as needed. 18 g 4   atorvastatin (LIPITOR) 40 MG tablet Take 1 tablet (40 mg total) by mouth daily.  90 tablet 3   diltiazem (CARDIZEM SR) 60 MG 12 hr capsule Take 1 capsule (60 mg total) by mouth daily.     dupilumab (DUPIXENT) 300 MG/2ML prefilled syringe INJECT 1 PREFILLED SYRINGE FOR A 300MG  DOSE SUBCUTANEOUSLY EVERY 14 DAYS DO NOT MAIL --- FOR CLINIC ADMINISTRATION ONLY     EPINEPHrine 0.3 mg/0.3 mL IJ SOAJ  injection INJECT 0.3 ML INTRAMUSCULARLY ONCE AS NEEDED     fluticasone furoate-vilanterol (BREO ELLIPTA) 200-25 MCG/ACT AEPB INHALE 1 PUFF BY MOUTH IN THE MORNING     levocetirizine (XYZAL) 5 MG tablet Take 1 tablet (5 mg total) by mouth every evening. 30 tablet 11   metFORMIN (GLUCOPHAGE) 500 MG tablet Take 1 tablet (500 mg total) by mouth 2 (two) times daily with a meal. 180 tablet 3   methimazole (TAPAZOLE) 5 MG tablet Take 0.5 tablets (2.5 mg total) by mouth daily.     omeprazole (PRILOSEC) 40 MG capsule Take 1 by mouth 30 minutes prior to a meal. 90 capsule 3   sertraline (ZOLOFT) 50 MG tablet TAKE ONE-HALF TABLET BY MOUTH IN THE MORNING FOR MENTAL HEALTH     Tiotropium Bromide Monohydrate 2.5 MCG/ACT AERS INHALE 2 PUFFS BY MOUTH ONCE A DAY     No facility-administered medications prior to visit.     Review of Systems:   Constitutional: No weight loss or gain, night sweats, fevers, chills. +fatigue, lassitude. HEENT: No headaches, difficulty swallowing, tooth/dental problems, or sore throat. No sneezing, itching, ear ache, nasal congestion, or post nasal drip CV:  No chest pain, orthopnea, PND, swelling in lower extremities, anasarca, dizziness, palpitations, syncope Resp: +shortness of breath with exertion; congested cough; wheezing. No hemoptysis. No chest wall deformity GI:  No heartburn, indigestion, abdominal pain, nausea, vomiting, diarrhea, change in bowel habits, loss of appetite, bloody stools.  GU: No dysuria, change in color of urine, urgency or frequency.   Skin: No rash, lesions, ulcerations MSK:  No joint pain or swelling.  Neuro: No dizziness or lightheadedness.  Psych: No depression or anxiety. Mood stable.     Physical Exam:  BP (!) 160/90 (BP Location: Right Arm, Patient Position: Sitting, Cuff Size: Normal)   Pulse 83   Temp 98 F (36.7 C) (Oral)   Ht 5\' 6"  (1.676 m)   Wt 174 lb 3.2 oz (79 kg)   SpO2 96% Comment: 4 lpm pulsed POC  BMI 28.12 kg/m   GEN:  Pleasant, interactive, acute on chronically-ill appearing; in mild acute respiratory distress upon arrival to exam room. HEENT:  Normocephalic and atraumatic. PERRLA. Sclera white. Nasal turbinates pink, moist and patent bilaterally. No rhinorrhea present. Oropharynx pink and moist, without exudate or edema. No lesions, ulcerations, or postnasal drip.  NECK:  Supple w/ fair ROM. No JVD present. Normal carotid impulses w/o bruits. Thyroid symmetrical with no goiter or nodules palpated. No lymphadenopathy.   CV: RRR, no m/r/g, no peripheral edema. Pulses intact, +2 bilaterally. No cyanosis, pallor or clubbing. PULMONARY:  Labored breathing, tachypnea. Significant expiratory and inspiratory wheezing bilaterally A&P. Improved post neb; still with bronchospasm but airflow and RR improved. No accessory muscle use.  GI: BS present and normoactive. Soft, non-tender to palpation. No organomegaly or masses detected.  MSK: No erythema, warmth or tenderness. Cap refil <2 sec all extrem. No deformities or joint swelling noted.  Neuro: A/Ox3. No focal deficits noted.   Skin: Warm, no lesions or rashe Psych: Normal affect and behavior. Judgement and thought content appropriate.     Lab Results:  CBC    Component Value Date/Time   WBC 15.1 (H) 03/25/2019 1145   RBC 5.31 03/25/2019 1145   HGB 15.9 03/25/2019 1145   HCT 48.3 03/25/2019 1145   PLT 253 03/25/2019 1145   MCV 91.0 03/25/2019 1145   MCH 29.9 03/25/2019 1145   MCHC 32.9 03/25/2019 1145   RDW 13.2 03/25/2019 1145   LYMPHSABS 2.3 10/07/2017 1850   MONOABS 0.9 10/07/2017 1850   EOSABS 1.0 (H) 10/07/2017 1850   BASOSABS 0.1 10/07/2017 1850    BMET    Component Value Date/Time   NA 136 11/26/2022 1401   K 4.4 11/26/2022 1401   CL 100 11/26/2022 1401   CO2 27 11/26/2022 1401   GLUCOSE 107 (H) 11/26/2022 1401   BUN 7 11/26/2022 1401   CREATININE 0.98 11/26/2022 1401   CALCIUM 9.8 11/26/2022 1401   GFRNONAA >60 03/25/2019 1145   GFRAA  >60 03/25/2019 1145    BNP    Component Value Date/Time   BNP 31.1 03/25/2019 1145     Imaging:  DG Chest 2 View  Result Date: 01/30/2023 CLINICAL DATA:  Shortness of breath.  Cough x3 weeks. EXAM: CHEST - 2 VIEW COMPARISON:  11/27/2022. FINDINGS: Linear atelectasis in the right lower lung. No consolidation or pulmonary edema. Stable cardiac and mediastinal contours. No pleural effusion or pneumothorax. IMPRESSION: No evidence of acute cardiopulmonary disease. Electronically Signed   By: Orvan Falconer M.D.   On: 01/30/2023 14:32    methylPREDNISolone acetate (DEPO-MEDROL) injection 80 mg     Date Action Dose Route User   01/30/2023 1440 Given 80 mg Intramuscular (Right Ventrogluteal) Michail Jewels, Amy M, CMA           No data to display          No results found for: "NITRICOXIDE"      Assessment & Plan:   COPD with acute exacerbation AECOPD. He arrived to the exam room with saturations between 88-90% on his 4 lpm POC. He had very labored breathing, tachypnea and significant bronchospasm on exam. Saturations improved with transition to continuous O2 4 lpm. Pt was administered an albuterol neb with improvement in respiratory status. CXR was obtain without acute process. Wells criteria with low probability of PE. Discussed outpatient treatment vs admission to the hospital for further management. With his improvement post neb treatment, shared decision to move forward with outpatient management. He was provided with very strict ED precautions and close follow up. We will treat him with depo inj 80 mg, prednisone taper and empiric azithromycin. Maximize bronchodilator regimen and trial change to [facility]. Concerned he's unable to generate the airflow for DPI (Breo). Target mucociliary clearance therapies. Action plan in place.  Patient Instructions  -Continue Albuterol inhaler 2 puffs or 3 mL neb every 6 hours as needed for shortness of breath or wheezing. Notify if symptoms persist  despite rescue inhaler/neb use. Use nebs 4 times a day until symptoms improve -Stop Breo and Spiriva. Try Breztri 2 puffs Twice daily. Brush tongue and rinse mouth afterwards. Use with spacer. We will see if we can [facility] if you feel like it works better -Continue levocetirizine (Xyzal) 1 tab daily  -Continue mucinex 1200 mg Twice daily for cough/congestion -Continue dupixent every 14 days -Continue omeprazole 1 tab daily -Continue supplemental oxygen 4 lpm for goal >88-90%. You need to use your continuous tanks (ordered for [employer] to bring) when your out of the house until I see you back. Use your  home concentrator at home.  Azithromycin - take 2 tabs on day one then 1 tab daily for four additional days. Take with food  Prednisone taper. 4 tabs for 3 days, then 3 tabs for 3 days, 2 tabs for 3 days, then 1 tab for 3 days, then stop. Take in AM with food.  Follow up in one week with Dr. Judeth Horn or Katie Lissandro Dilorenzo,NP. If symptoms do not improve or worsen, please contact office for sooner follow up or seek emergency care.    Chronic respiratory failure with hypoxia Unable to maintain saturations on POC. Transitioned to 4 lpm continuous via portable tank and was able to maintain between 92-96%. He was advised to use continuous flow tanks and home concentrator until symptoms improve. Monitor saturations at home for goal >88-90%. Orders placed for [employer] to bring him portable tanks.    I spent 42 minutes of dedicated to the care of this patient on the date of this encounter to include pre-visit review of records, face-to-face time with the patient discussing conditions above, post visit ordering of testing, clinical documentation with the electronic health record, making appropriate referrals as documented, and communicating necessary findings to members of the patients care team.  Noemi Chapel, NP 01/30/2023  Pt aware and understands NP's role.

## 2023-01-30 NOTE — Addendum Note (Signed)
Addended byClyda Greener M on: 01/30/2023 03:30 PM   Modules accepted: Orders

## 2023-01-30 NOTE — Assessment & Plan Note (Signed)
Unable to maintain saturations on POC. Transitioned to 4 lpm continuous via portable tank and was able to maintain between 92-96%. He was advised to use continuous flow tanks and home concentrator until symptoms improve. Monitor saturations at home for goal >88-90%. Orders placed for [employer] to bring him portable tanks.

## 2023-01-30 NOTE — Addendum Note (Signed)
Addended byClyda Greener M on: 01/30/2023 05:06 PM   Modules accepted: Orders

## 2023-01-30 NOTE — Assessment & Plan Note (Addendum)
AECOPD. He arrived to the exam room with saturations between 88-90% on his 4 lpm POC. He had very labored breathing, tachypnea and significant bronchospasm on exam. Saturations improved with transition to continuous O2 4 lpm. Pt was administered an albuterol neb with improvement in respiratory status. CXR was obtain without acute process. Wells criteria with low probability of PE. Discussed outpatient treatment vs admission to the hospital for further management. With his improvement post neb treatment, shared decision to move forward with outpatient management. He was provided with very strict ED precautions and close follow up. We will treat him with depo inj 80 mg, prednisone taper and empiric azithromycin. Maximize bronchodilator regimen and trial change to [facility]. Concerned he's unable to generate the airflow for DPI (Breo). Target mucociliary clearance therapies. Action plan in place.  Patient Instructions  -Continue Albuterol inhaler 2 puffs or 3 mL neb every 6 hours as needed for shortness of breath or wheezing. Notify if symptoms persist despite rescue inhaler/neb use. Use nebs 4 times a day until symptoms improve -Stop Breo and Spiriva. Try Breztri 2 puffs Twice daily. Brush tongue and rinse mouth afterwards. Use with spacer. We will [facility] if [facility] can [facility] if you feel like it works better -Continue levocetirizine (Xyzal) 1 tab daily  -Continue mucinex 1200 mg Twice daily for cough/congestion -Continue dupixent every 14 days -Continue omeprazole 1 tab daily -Continue supplemental oxygen 4 lpm for goal >88-90%. You need to use your continuous tanks (ordered for [facility] to bring) when your out of the house until I see you back. Use your home concentrator at home.  Azithromycin - take 2 tabs on day one then 1 tab daily for four additional days. Take with food  Prednisone taper. 4 tabs for 3 days, then 3 tabs for 3 days, 2 tabs for 3 days, then 1 tab for 3 days, then stop. Take in  AM with food.  Follow up in one week with Dr. Judeth Horn or Katie Ladona Rosten,NP. If symptoms do not improve or worsen, please contact office for sooner follow up or seek emergency care.

## 2023-01-30 NOTE — Patient Instructions (Addendum)
-  Continue Albuterol inhaler 2 puffs or 3 mL neb every 6 hours as needed for shortness of breath or wheezing. Notify if symptoms persist despite rescue inhaler/neb use. Use nebs 4 times a day until symptoms improve -Stop Breo and Spiriva. Try Breztri 2 puffs Twice daily. Brush tongue and rinse mouth afterwards. Use with spacer. We will see if we can get this [facility] if you feel like it works better -Continue levocetirizine (Xyzal) 1 tab daily  -Continue mucinex 1200 mg Twice daily for cough/congestion -Continue dupixent every 14 days -Continue omeprazole 1 tab daily -Continue supplemental oxygen 4 lpm for goal >88-90%. You need to use your continuous tanks (ordered for [facility] to bring) when your out of the house until I see you back. Use your home concentrator at home.  Azithromycin - take 2 tabs on day one then 1 tab daily for four additional days. Take with food  Prednisone taper. 4 tabs for 3 days, then 3 tabs for 3 days, 2 tabs for 3 days, then 1 tab for 3 days, then stop. Take in AM with food.  Follow up in one week with Dr. Judeth Horn or Katie Ellen Mayol,NP. If symptoms do not improve or worsen, please contact office for sooner follow up or seek emergency care.

## 2023-02-02 NOTE — Telephone Encounter (Signed)
Called patient and spoke with daughter, Terrilyn Saver (on [place]).  She will bring patient back to our office today to get labs drawn.  Will keep orders as is in EPIC. Nothing further needed.

## 2023-02-03 ENCOUNTER — Other Ambulatory Visit (INDEPENDENT_AMBULATORY_CARE_PROVIDER_SITE_OTHER): Payer: No Typology Code available for payment source

## 2023-02-03 DIAGNOSIS — J441 Chronic obstructive pulmonary disease with (acute) exacerbation: Secondary | ICD-10-CM | POA: Diagnosis not present

## 2023-02-03 LAB — CBC WITH DIFFERENTIAL/PLATELET
Basophils Absolute: 0.1 10*3/uL (ref 0.0–0.1)
Basophils Relative: 0.7 % (ref 0.0–3.0)
Eosinophils Absolute: 0.1 10*3/uL (ref 0.0–0.7)
Eosinophils Relative: 0.5 % (ref 0.0–5.0)
HCT: 47.9 % (ref 39.0–52.0)
Hemoglobin: 16.1 g/dL (ref 13.0–17.0)
Lymphocytes Relative: 19.1 % (ref 12.0–46.0)
Lymphs Abs: 2.2 10*3/uL (ref 0.7–4.0)
MCHC: 33.5 g/dL (ref 30.0–36.0)
MCV: 94.7 fl (ref 78.0–100.0)
Monocytes Absolute: 0.8 10*3/uL (ref 0.1–1.0)
Monocytes Relative: 7 % (ref 3.0–12.0)
Neutro Abs: 8.3 10*3/uL — ABNORMAL HIGH (ref 1.4–7.7)
Neutrophils Relative %: 72.7 % (ref 43.0–77.0)
Platelets: 329 10*3/uL (ref 150.0–400.0)
RBC: 5.05 Mil/uL (ref 4.22–5.81)
RDW: 13.8 % (ref 11.5–15.5)
WBC: 11.4 10*3/uL — ABNORMAL HIGH (ref 4.0–10.5)

## 2023-02-03 LAB — BASIC METABOLIC PANEL
BUN: 19 mg/dL (ref 6–23)
CO2: 25 mEq/L (ref 19–32)
Calcium: 9.7 mg/dL (ref 8.4–10.5)
Chloride: 102 mEq/L (ref 96–112)
Creatinine, Ser: 0.99 mg/dL (ref 0.40–1.50)
GFR: 73.27 mL/min (ref >60.00)
Glucose, Bld: 149 mg/dL — ABNORMAL HIGH (ref 70–99)
Potassium: 3.7 mEq/L (ref 3.5–5.1)
Sodium: 138 mEq/L (ref 135–145)

## 2023-02-05 NOTE — Progress Notes (Signed)
CXR without evidence of pna. Thanks.

## 2023-02-09 ENCOUNTER — Ambulatory Visit (INDEPENDENT_AMBULATORY_CARE_PROVIDER_SITE_OTHER): Payer: No Typology Code available for payment source | Admitting: Nurse Practitioner

## 2023-02-09 ENCOUNTER — Encounter: Payer: Self-pay | Admitting: Nurse Practitioner

## 2023-02-09 VITALS — BP 140/72 | HR 71 | Temp 98.0°F | Ht 67.0 in | Wt 171.4 lb

## 2023-02-09 DIAGNOSIS — J9611 Chronic respiratory failure with hypoxia: Secondary | ICD-10-CM | POA: Diagnosis not present

## 2023-02-09 DIAGNOSIS — J449 Chronic obstructive pulmonary disease, unspecified: Secondary | ICD-10-CM

## 2023-02-09 MED ORDER — BREZTRI AEROSPHERE 160-9-4.8 MCG/ACT IN AERO
2.0000 | INHALATION_SPRAY | Freq: Two times a day (BID) | RESPIRATORY_TRACT | 11 refills | Status: AC
Start: 2023-02-09 — End: ?

## 2023-02-09 NOTE — Assessment & Plan Note (Addendum)
Resolved exacerbation. Clinically improved. He received better benefit from Breztri vs Breo/Spiriva. We will send updated rx to the [place] today. Provided with additional samples. Action plan in place. Encouraged to remain active.  Patient Instructions  -Continue Albuterol inhaler 2 puffs or 3 mL neb every 6 hours as needed for shortness of breath or wheezing. Notify if symptoms persist despite rescue inhaler/neb use. Use nebs 4 times a day until symptoms improve -Continue Breztri 2 puffs Twice daily. Brush tongue and rinse mouth afterwards. Use with spacer. I have sent a prescription to the [place]; let me know if you hear something about them not covering this -Continue levocetirizine (Xyzal) 1 tab daily  -Continue mucinex 1200 mg Twice daily for cough/congestion -Continue dupixent every 14 days -Continue omeprazole 1 tab daily -Continue supplemental oxygen 3-4 lpm for goal >88-90%    Follow up in 3 months with Dr. Judeth Horn or Katie Donny Heffern,NP. If symptoms do not improve or worsen, please contact office for sooner follow up or seek emergency care.

## 2023-02-09 NOTE — Patient Instructions (Addendum)
-  Continue Albuterol inhaler 2 puffs or 3 mL neb every 6 hours as needed for shortness of breath or wheezing. Notify if symptoms persist despite rescue inhaler/neb use. Use nebs 4 times a day until symptoms improve -Continue Breztri 2 puffs Twice daily. Brush tongue and rinse mouth afterwards. Use with spacer. I have sent a prescription to the [place]; let me know if you hear something about them not covering this -Continue levocetirizine (Xyzal) 1 tab daily  -Continue mucinex 1200 mg Twice daily for cough/congestion -Continue dupixent every 14 days -Continue omeprazole 1 tab daily -Continue supplemental oxygen 3-4 lpm for goal >88-90%    Follow up in 3 months with Dr. Judeth Horn or Katie Mercedes Valeriano,NP. If symptoms do not improve or worsen, please contact office for sooner follow up or seek emergency care.

## 2023-02-09 NOTE — Progress Notes (Signed)
@Patient  ID: Eugene Daniels, male    DOB: September 21, 1946, 77 y.o.   MRN: [id number]  Chief Complaint  Patient presents with   Follow-up    Pt states he is doing better, pt never received tanks, and never received prednisone.     Referring provider: Sharlene Dory*  HPI: 77 year old male, former smoker followed for COPD and chronic respiratory failure on supplemental O2. He is a patient of Dr. Laurena Spies and last seen in office 01/30/2023 by Allison Quarry NP. Hospitalized 09/01/2022 and 12/02/2022. Past medical history significant for PAD, DM, BPH, RLS, HLD, seasonal allergies.   TEST/EVENTS:  11/28/2022 echo: findings of reverse Takotsubo. EF 45-50%. RV size and function nl. 11/28/2022 CTA chest: CAD, atherosclerosis. No PE. No LAD. Emphysematous changes. Bronchial wall thickening b/l and mucus plugging RLL. Mild atelectasis or scarring in lingular segment of LUL. No acute process. Possible underlying cirrhosis.  01/30/2023 CXR: linear atelectasis in the right lower lung.   12/18/2022: OV with Dr. Judeth Horn. Returns to clinic after prolonged absence. He was hospitalized at [facility] 08/2022 with COVID, AECOPD requiring O2. He was discharged on 2 lpm supplemental O2, new requirement. He was admitted again 11/2022 for AECOPD. Discharged on 3 lpm. Received O2 from the [place] but they cannot access POC. He is close to running out of oxygen as the [place] has not serviced them. POC qualification completed. He is on Breo and spiriva for AECOPD. Could consider neb therapies if exacerbations continue.  01/30/2023: OV with Anaid Haney NP for acute visit. He has been having trouble with his breathing for the past 3 weeks. He feels very short winded and gets out of breath just walking to the bathroom. He has a persistent congested cough but has been unable to produce any sputum. He has noticed wheezing and chest feels tight. He denies any fevers, chills, hemoptysis, leg swelling, orthopnea. He did a breathing treatment this  morning. He uses his Breo and his Spiriva. He is wearing his oxygen 4 lpm. Has not been checking his sats at home.   02/09/2023: Today - follow up Patient presents today for follow up after being treated for AECOPD. He ended up not getting the prednisone from the pharmacy for some reason. He did get the z pack and completed this. Had a depo shot at his last visit. He was also changed to [employer]. Feels like this works better than the [employer] and spiriva did. He's feeling much improved today. Cough is gone. Not noticing much wheezing. Breathing feels like it's back to his baseline. He denies any fevers, chills, hemoptysis, leg swelling, orthopnea. He was able to go back on his POC 4 lpm. Has not been checking his sats at home.   No Known Allergies  Immunization History  Administered Date(s) Administered   Fluad Quad(high Dose 65+) 07/10/2020, 08/22/2021   Hep A / Hep B 02/07/2013, 11/02/2013   Influenza, High Dose Seasonal PF 08/06/2018, 08/16/2019, 08/11/2022   Influenza,inj,Quad PF,6+ Mos 12/15/2022   Influenza-Unspecified 07/13/2012, 08/13/2014, 12/12/2015, 08/18/2016   Moderna Sars-Covid-2 Vaccination 11/26/2019, 12/24/2019, 01/08/2021   Pneumococcal Conjugate-13 08/14/2015, 08/06/2018   Pneumococcal Polysaccharide-23 05/22/2016, 07/27/2019   RSV,unspecified 10/14/2022   Rsv, Bivalent, Protein Subunit Rsvpref,pf Verdis Frederickson) 10/14/2022   Tdap 11/02/2013, 08/06/2018   Zoster Recombinat (Shingrix) 06/13/2020, 08/16/2020   Zoster, Unspecified 06/13/2020, 08/16/2020    Past Medical History:  Diagnosis Date   Arthritis    COPD (chronic obstructive pulmonary disease) (HCC)    Diabetes mellitus type 2 in nonobese (HCC)  ED (erectile dysfunction)    Enlarged prostate    Hyperlipidemia     Tobacco History: Social History   Tobacco Use  Smoking Status Former   Packs/day: 1.00   Years: 30.00   Additional pack years: 0.00   Total pack years: 30.00   Types: Cigarettes  Smokeless Tobacco  Never  Tobacco Comments   Quit 2010   Counseling given: Not Answered Tobacco comments: Quit 2010   Outpatient Medications Prior to Visit  Medication Sig Dispense Refill   albuterol (PROAIR HFA) 108 (90 Base) MCG/ACT inhaler Inhale 1-2 puffs into the lungs every 4 (four) hours as needed. 18 g 4   atorvastatin (LIPITOR) 40 MG tablet Take 1 tablet (40 mg total) by mouth daily. 90 tablet 3   azithromycin (ZITHROMAX) 250 MG tablet Take 2 tablets on day one then take 1 tablet daily for four additional days 6 tablet 0   diltiazem (CARDIZEM SR) 60 MG 12 hr capsule Take 1 capsule (60 mg total) by mouth daily.     dupilumab (DUPIXENT) 300 MG/2ML prefilled syringe INJECT 1 PREFILLED SYRINGE FOR A 300MG  DOSE SUBCUTANEOUSLY EVERY 14 DAYS DO NOT MAIL --- FOR CLINIC ADMINISTRATION ONLY     EPINEPHrine 0.3 mg/0.3 mL IJ SOAJ injection INJECT 0.3 ML INTRAMUSCULARLY ONCE AS NEEDED     levocetirizine (XYZAL) 5 MG tablet Take 1 tablet (5 mg total) by mouth every evening. 30 tablet 11   metFORMIN (GLUCOPHAGE) 500 MG tablet Take 1 tablet (500 mg total) by mouth 2 (two) times daily with a meal. 180 tablet 3   methimazole (TAPAZOLE) 5 MG tablet Take 0.5 tablets (2.5 mg total) by mouth daily.     omeprazole (PRILOSEC) 40 MG capsule Take 1 by mouth 30 minutes prior to a meal. 90 capsule 3   predniSONE (DELTASONE) 10 MG tablet 4 tabs for 3 days, then 3 tabs for 3 days, 2 tabs for 3 days, then 1 tab for 3 days, then stop 30 tablet 0   sertraline (ZOLOFT) 50 MG tablet TAKE ONE-HALF TABLET BY MOUTH IN THE MORNING FOR MENTAL HEALTH     Budeson-Glycopyrrol-Formoterol (BREZTRI AEROSPHERE) 160-9-4.8 MCG/ACT AERO Inhale 2 puffs into the lungs in the morning and at bedtime. 2 each 0   fluticasone furoate-vilanterol (BREO ELLIPTA) 200-25 MCG/ACT AEPB INHALE 1 PUFF BY MOUTH IN THE MORNING     Tiotropium Bromide Monohydrate 2.5 MCG/ACT AERS INHALE 2 PUFFS BY MOUTH ONCE A DAY     No facility-administered medications prior to  visit.     Review of Systems:   Constitutional: No weight loss or gain, night sweats, fevers, chills. +fatigue, lassitude (improved). HEENT: No headaches, difficulty swallowing, tooth/dental problems, or sore throat. No sneezing, itching, ear ache, nasal congestion, or post nasal drip CV:  No chest pain, orthopnea, PND, swelling in lower extremities, anasarca, dizziness, palpitations, syncope Resp: +shortness of breath with exertion (improved; baseline); rare wheeze. No cough or excessive mucus production. No hemoptysis. No chest wall deformity GI:  No heartburn, indigestion, abdominal pain, nausea, vomiting, diarrhea, change in bowel habits, loss of appetite, bloody stools.  GU: No dysuria, change in color of urine, urgency or frequency.   Skin: No rash, lesions, ulcerations MSK:  No joint pain or swelling.  Neuro: No dizziness or lightheadedness.  Psych: No depression or anxiety. Mood stable.     Physical Exam:  BP (!) 140/72   Pulse 71   Temp 98 F (36.7 C) (Oral)   Ht 5\' 7"  (1.702 m)  Wt 171 lb 6.4 oz (77.7 kg)   SpO2 98%   BMI 26.85 kg/m   GEN: Pleasant, interactive, chronically-ill appearing; in no acute distress HEENT:  Normocephalic and atraumatic. PERRLA. Sclera white. Nasal turbinates pink, moist and patent bilaterally. No rhinorrhea present. Oropharynx pink and moist, without exudate or edema. No lesions, ulcerations, or postnasal drip.  NECK:  Supple w/ fair ROM. No JVD present. Normal carotid impulses w/o bruits. Thyroid symmetrical with no goiter or nodules palpated. No lymphadenopathy.   CV: RRR, no m/r/g, no peripheral edema. Pulses intact, +2 bilaterally. No cyanosis, pallor or clubbing. PULMONARY:  Unlabored, regular breathing. Diminished bases otherwise clear bilaterally A&P w/o wheezes/rales/rhonchi. No accessory muscle use.  GI: BS present and normoactive. Soft, non-tender to palpation. No organomegaly or masses detected.  MSK: No erythema, warmth or  tenderness. Cap refil <2 sec all extrem. No deformities or joint swelling noted.  Neuro: A/Ox3. No focal deficits noted.   Skin: Warm, no lesions or rashe Psych: Normal affect and behavior. Judgement and thought content appropriate.     Lab Results:  CBC    Component Value Date/Time   WBC 11.4 (H) 02/03/2023 1014   RBC 5.05 02/03/2023 1014   HGB 16.1 02/03/2023 1014   HCT 47.9 02/03/2023 1014   PLT 329.0 02/03/2023 1014   MCV 94.7 02/03/2023 1014   MCH 29.9 03/25/2019 1145   MCHC 33.5 02/03/2023 1014   RDW 13.8 02/03/2023 1014   LYMPHSABS 2.2 02/03/2023 1014   MONOABS 0.8 02/03/2023 1014   EOSABS 0.1 02/03/2023 1014   BASOSABS 0.1 02/03/2023 1014    BMET    Component Value Date/Time   NA 138 02/03/2023 1014   K 3.7 02/03/2023 1014   CL 102 02/03/2023 1014   CO2 25 02/03/2023 1014   GLUCOSE 149 (H) 02/03/2023 1014   BUN 19 02/03/2023 1014   CREATININE 0.99 02/03/2023 1014   CALCIUM 9.7 02/03/2023 1014   GFRNONAA >60 03/25/2019 1145   GFRAA >60 03/25/2019 1145    BNP    Component Value Date/Time   BNP 31.1 03/25/2019 1145     Imaging:  DG Chest 2 View  Result Date: 01/30/2023 CLINICAL DATA:  Shortness of breath.  Cough x3 weeks. EXAM: CHEST - 2 VIEW COMPARISON:  11/27/2022. FINDINGS: Linear atelectasis in the right lower lung. No consolidation or pulmonary edema. Stable cardiac and mediastinal contours. No pleural effusion or pneumothorax. IMPRESSION: No evidence of acute cardiopulmonary disease. Electronically Signed   By: Orvan Falconer M.D.   On: 01/30/2023 14:32    methylPREDNISolone acetate (DEPO-MEDROL) injection 80 mg     Date Action Dose Route User   01/30/2023 1440 Given 80 mg Intramuscular (Right Ventrogluteal) Michail Jewels, Amy M, CMA           No data to display          No results found for: "NITRICOXIDE"      Assessment & Plan:   Chronic obstructive pulmonary disease (HCC) Resolved exacerbation. Clinically improved. He received  better benefit from Breztri vs Breo/Spiriva. We will send updated rx to the [place] today. Provided with additional samples. Action plan in place. Encouraged to remain active.  Patient Instructions  -Continue Albuterol inhaler 2 puffs or 3 mL neb every 6 hours as needed for shortness of breath or wheezing. Notify if symptoms persist despite rescue inhaler/neb use. Use nebs 4 times a day until symptoms improve -Continue Breztri 2 puffs Twice daily. Brush tongue and rinse mouth afterwards. Use with spacer.  I have sent a prescription to the [place]; let me know if you hear something about them not covering this -Continue levocetirizine (Xyzal) 1 tab daily  -Continue mucinex 1200 mg Twice daily for cough/congestion -Continue dupixent every 14 days -Continue omeprazole 1 tab daily -Continue supplemental oxygen 3-4 lpm for goal >88-90%    Follow up in 3 months with Dr. Judeth Horn or Katie Pearlie Nies,NP. If symptoms do not improve or worsen, please contact office for sooner follow up or seek emergency care.    Chronic respiratory failure with hypoxia (HCC) Stable and back to baseline. Able to maintain on POC today. He never received portable tanks from the DME - will f/u with [employer] regarding this as he should have at least 2 at home for emergency use in case of power outage or POC malfunction. Goal O2 >88-90%    I spent 28 minutes of dedicated to the care of this patient on the date of this encounter to include pre-visit review of records, face-to-face time with the patient discussing conditions above, post visit ordering of testing, clinical documentation with the electronic health record, making appropriate referrals as documented, and communicating necessary findings to members of the patients care team.  Noemi Chapel, NP 02/09/2023  Pt aware and understands NP's role.

## 2023-02-09 NOTE — Assessment & Plan Note (Signed)
Stable and back to baseline. Able to maintain on POC today. He never received portable tanks from the DME - will f/u with [facility] regarding this as he should have at least 2 at home for emergency use in case of power outage or POC malfunction. Goal O2 >88-90%

## 2023-02-12 LAB — ALPHA-1 ANTITRYPSIN PHENOTYPE: A-1 Antitrypsin, Ser: 99 mg/dL (ref 83–199)

## 2023-02-23 ENCOUNTER — Telehealth: Payer: Self-pay

## 2023-02-23 ENCOUNTER — Other Ambulatory Visit (HOSPITAL_COMMUNITY): Payer: Self-pay

## 2023-02-23 DIAGNOSIS — E559 Vitamin D deficiency, unspecified: Secondary | ICD-10-CM | POA: Diagnosis not present

## 2023-02-23 DIAGNOSIS — E782 Mixed hyperlipidemia: Secondary | ICD-10-CM | POA: Diagnosis not present

## 2023-02-23 DIAGNOSIS — E0851 Diabetes mellitus due to underlying condition with diabetic peripheral angiopathy without gangrene: Secondary | ICD-10-CM | POA: Diagnosis not present

## 2023-02-23 DIAGNOSIS — Z79899 Other long term (current) drug therapy: Secondary | ICD-10-CM | POA: Diagnosis not present

## 2023-02-23 NOTE — Telephone Encounter (Signed)
PA request received via fax for [place] from [place]  Per [facility] preferred alternatives are Advair HFA/DPI, Stiolto, Spiriva Respimat/handihaler, Asmanex  Patient also has [employer] Gold Plus which test claim shows as a $0.00 co-pay

## 2023-02-25 ENCOUNTER — Telehealth: Payer: Self-pay | Admitting: Pulmonary Disease

## 2023-02-25 DIAGNOSIS — J9611 Chronic respiratory failure with hypoxia: Secondary | ICD-10-CM

## 2023-02-25 DIAGNOSIS — J449 Chronic obstructive pulmonary disease, unspecified: Secondary | ICD-10-CM | POA: Diagnosis not present

## 2023-02-25 NOTE — Telephone Encounter (Signed)
Called patient twice. Someone answered the phone but the call dropped. I called patient again and call went straight to VM. Left message for patient to call.

## 2023-02-25 NOTE — Telephone Encounter (Signed)
Pt calling in because Nurse cobb mentioned getting him on a oxygen tank but pt states he hasn't heard anything from [facility]

## 2023-02-25 NOTE — Telephone Encounter (Signed)
PT ret call. I explained we tried reaching out and what happened. Pls try again @ [phone]

## 2023-02-25 NOTE — Telephone Encounter (Signed)
Called and spoke with patient. He stated that he received his POC from [facility] but never received a home concentrator or tank from them. He has been using the POC without any issues. I advised him that I would send a message to [employer] to check on the concentrator.    Community message has been sent to [employer].

## 2023-03-10 ENCOUNTER — Telehealth: Payer: Self-pay | Admitting: Pulmonary Disease

## 2023-03-10 NOTE — Telephone Encounter (Signed)
Garret Reddish from the [facility] is calling to speak w nurse in regards to pt. Call bck number [phone] ext. [phone]

## 2023-03-13 NOTE — Telephone Encounter (Signed)
Tried to contact Regal from the [place] in [place] regarding patient. Phone just rang at the [place] .Will try again at a later time .

## 2023-03-18 NOTE — Telephone Encounter (Signed)
Spoke with rep from [facility]-states there is no one there by the name Eugene Daniels. States he looked through patients chart nothing was noted. I asked if patient maybe needed medications refilled. He advised patient had plenty of refills and he could just call for those. Closing encounter since nothing is needed.

## 2023-03-23 NOTE — Telephone Encounter (Signed)
Spoke with the pt  He is requesting order to be sent to [facility] for backup tanks  Order placed  Nothing further needed

## 2023-03-23 NOTE — Telephone Encounter (Signed)
Patient states needs oxygen tanks. Called answering service over the weekend. Patient phone number is [phone].

## 2023-03-26 ENCOUNTER — Telehealth: Payer: Self-pay

## 2023-03-26 NOTE — Transitions of Care (Post Inpatient/ED Visit) (Signed)
03/26/2023  Name: Eugene Daniels MRN: [id number] DOB: 24-Aug-1946  Today's TOC FU Call Status: Today's TOC FU Call Status:: Successful TOC FU Call Competed TOC FU Call Complete Date: 03/26/23  Transition Care Management Follow-up Telephone Call Date of Discharge: 03/25/23 Discharge Facility: Other ([facility]) Name of Other ([facility]) Discharge Facility: [facility] Type of Discharge: Inpatient Admission Primary Inpatient Discharge Diagnosis:: "SOB" How have you been since you were released from the hospital?: Better (Brief call with pt-states he couldn't talk long. He voices he is feeling better-has had no SOB since returning home-using oxygen.) Any questions or concerns?: No  Items Reviewed: Did you receive and understand the discharge instructions provided?: Yes Medications obtained,verified, and reconciled?: Partial Review Completed Reason for Partial Mediation Review: pt could not talk long-company arrived to the home-states he will go pick up new meds from pharmacy today Any new allergies since your discharge?: No Dietary orders reviewed?: Yes Type of Diet Ordered:: low salt/heart healthy Do you have support at home?: No  Medications Reviewed Today: Medications Reviewed Today     Reviewed by Charlyn Minerva, RN (Registered Nurse) on 03/26/23 at 1623  Med List Status: <None>   Medication Order Taking? Sig Documenting Provider Last Dose Status Informant  albuterol (PROAIR HFA) 108 (90 Base) MCG/ACT inhaler [id number]  Inhale 1-2 puffs into the lungs every 4 (four) hours as needed. Sharlene Dory, DO  Active   amoxicillin-clavulanate (AUGMENTIN) 875-125 MG tablet [id number] Yes Take 1 tablet by mouth 2 (two) times daily. Take 1 tablet by mouth 2 (two) times a day for 7 doses. [provider]  Active Self  atorvastatin (LIPITOR) 40 MG tablet [id number]  Take 1 tablet (40 mg total) by mouth daily. Sharlene Dory, DO  Active    azithromycin (ZITHROMAX) 250 MG tablet [id number]  Take 2 tablets on day one then take 1 tablet daily for four additional days Noemi Chapel, NP  Active   Budeson-Glycopyrrol-Formoterol (BREZTRI AEROSPHERE) 160-9-4.8 MCG/ACT Sandrea Matte [id number]  Inhale 2 puffs into the lungs in the morning and at bedtime. Cobb, Ruby Cola, NP  Active   budesonide (PULMICORT) 0.5 MG/2ML nebulizer solution [id number] Yes Take 0.5 mg by nebulization 2 (two) times daily. Take 2 mL (0.5 mg total) by nebulization 2 (two) times a day. [provider]  Active Self  diltiazem (CARDIZEM SR) 60 MG 12 hr capsule [id number]  Take 1 capsule (60 mg total) by mouth daily. Sharlene Dory, DO  Active   dupilumab (DUPIXENT) 300 MG/2ML prefilled syringe [id number]  INJECT 1 PREFILLED SYRINGE FOR A 300MG  DOSE SUBCUTANEOUSLY EVERY 14 DAYS DO NOT MAIL --- FOR CLINIC ADMINISTRATION ONLY [provider]  Active   EPINEPHrine 0.3 mg/0.3 mL IJ SOAJ injection [id number]  INJECT 0.3 ML INTRAMUSCULARLY ONCE AS NEEDED [provider]  Active   formoterol (PERFOROMIST) 20 MCG/2ML nebulizer solution [id number] Yes Take 20 mcg by nebulization 2 (two) times daily. Take 2 mL (20 mcg total) by nebulization 2 (two) times a day. [provider]  Active Self  levocetirizine (XYZAL) 5 MG tablet [id number]  Take 1 tablet (5 mg total) by mouth every evening. Sharlene Dory, DO  Active   metFORMIN (GLUCOPHAGE) 500 MG tablet [id number]  Take 1 tablet (500 mg total) by mouth 2 (two) times daily with a meal. Sharlene Dory, DO  Active   methimazole (TAPAZOLE) 5 MG tablet [id number]  Take 0.5 tablets (2.5 mg total) by mouth daily. Sharlene Dory, DO  Active  omeprazole (PRILOSEC) 40 MG capsule [id number]  Take 1 by mouth 30 minutes prior to a meal. Carmelia Roller, Jilda Roche, DO  Active   predniSONE (DELTASONE) 10 MG tablet [id number]  4 tabs for 3 days, then 3 tabs for 3 days, 2 tabs for 3 days, then 1 tab  for 3 days, then stop Cobb, Ruby Cola, NP  Active   sertraline (ZOLOFT) 50 MG tablet [id number]  TAKE ONE-HALF TABLET BY MOUTH IN THE [facility] [provider]  Active             Home Care and Equipment/Supplies: Were Home Health Services Ordered?: NA Any new equipment or medical supplies ordered?: NA  Functional Questionnaire: Do you need assistance with bathing/showering or dressing?:  (unable to assess ADLs-pt had to end call)  Follow up appointments reviewed: PCP Follow-up appointment confirmed?: No (Pt vocies he [place]-plan to change all his MDs to [facility] MD sor use the [place]. He did nto want to make PCP follow up appt with currnet provider.) MD Provider Line Number:[phone] Given: No (Also instruted pt he could contact insurance provider to get list of in-network MDs in his new area) Specialist Hospital Follow-up appointment confirmed?: No Reason Specialist Follow-Up Not Confirmed: Patient has Specialist Provider Number and will Call for Appointment Do you need transportation to your follow-up appointment?: No Do you understand care options if your condition(s) worsen?: Yes-patient verbalized understanding   TOC Interventions Today    Flowsheet Row Most Recent Value  TOC Interventions   TOC Interventions Discussed/Reviewed TOC Interventions Discussed, S/S of infection      Interventions Today    Flowsheet Row Most Recent Value  Chronic Disease   Chronic disease during today's visit Chronic Obstructive Pulmonary Disease (COPD)  General Interventions   General Interventions Discussed/Reviewed General Interventions Discussed, Doctor Visits, Durable Medical Equipment (DME)  Doctor Visits Discussed/Reviewed Doctor Visits Discussed, Specialist, PCP  Durable Medical Equipment (DME) Oxygen  [pt reports he is using oxygen at 4L/min cont. via ]  PCP/Specialist Visits Compliance with follow-up visit  Education Interventions    Education Provided Provided Education  Provided Verbal Education On When to see the doctor, Medication  Nutrition Interventions   Nutrition Discussed/Reviewed Nutrition Discussed  Pharmacy Interventions   Pharmacy Dicussed/Reviewed Pharmacy Topics Discussed, Medications and their functions       Alessandra Grout [facility] Care Management Care Management Community Coordinator Direct Phone: [phone] Toll Free: [phone] Fax: [phone]

## 2023-03-30 ENCOUNTER — Telehealth: Payer: Self-pay | Admitting: Family Medicine

## 2023-03-30 NOTE — Telephone Encounter (Signed)
Belinda from [facility] has called for a verbal order request for physical therapy start of care evaluation   Belinda: [phone]

## 2023-03-30 NOTE — Telephone Encounter (Signed)
Called left a detailed message of PCP verbal ok

## 2023-03-31 ENCOUNTER — Telehealth: Payer: Self-pay | Admitting: Pulmonary Disease

## 2023-03-31 NOTE — Telephone Encounter (Signed)
I see no PT order from our office. What is the PT for? Should this be routed to his PCP?

## 2023-03-31 NOTE — Telephone Encounter (Signed)
[facility] needs verbal orders for pt. For physical therapy and if approved need last face-to-face notes and med. List fax#[phone]

## 2023-04-01 NOTE — Telephone Encounter (Signed)
Dr. Judeth Horn looks like pcp has already gave verbal order for PT I seen the message in a recent telephone visits. From what I can tell pcp initiated therapy back in February.  Just routing to you as an [place]

## 2023-05-12 ENCOUNTER — Ambulatory Visit: Payer: No Typology Code available for payment source | Admitting: Pulmonary Disease
